# Patient Record
Sex: Male | Born: 1940 | Race: White | Hispanic: No | Marital: Single | Smoking: Never smoker
Health system: Southern US, Community
[De-identification: ages and names within clinical notes are randomized; demographics above are authoritative.]

---

## 2010-03-14 ENCOUNTER — Emergency Department (HOSPITAL_COMMUNITY): Admission: EM | Admit: 2010-03-14 | Discharge: 2010-03-14 | Payer: Self-pay | Admitting: Emergency Medicine

## 2017-01-16 ENCOUNTER — Telehealth (HOSPITAL_COMMUNITY): Payer: Self-pay | Admitting: Pharmacy Technician

## 2020-01-27 ENCOUNTER — Encounter (HOSPITAL_COMMUNITY): Payer: Self-pay | Admitting: Emergency Medicine

## 2020-01-27 ENCOUNTER — Inpatient Hospital Stay (HOSPITAL_COMMUNITY)
Admission: EM | Admit: 2020-01-27 | Discharge: 2020-01-31 | DRG: 374 | Disposition: A | Discharge status: Hospice | Payer: Medicare Other | Attending: Family Medicine | Admitting: Family Medicine

## 2020-01-27 ENCOUNTER — Other Ambulatory Visit: Payer: Self-pay

## 2020-01-27 DIAGNOSIS — R64 Cachexia: Secondary | ICD-10-CM | POA: Diagnosis present

## 2020-01-27 DIAGNOSIS — E861 Hypovolemia: Secondary | ICD-10-CM | POA: Diagnosis present

## 2020-01-27 DIAGNOSIS — J690 Pneumonitis due to inhalation of food and vomit: Secondary | ICD-10-CM | POA: Diagnosis present

## 2020-01-27 DIAGNOSIS — G893 Neoplasm related pain (acute) (chronic): Secondary | ICD-10-CM | POA: Diagnosis present

## 2020-01-27 DIAGNOSIS — D500 Iron deficiency anemia secondary to blood loss (chronic): Secondary | ICD-10-CM | POA: Diagnosis present

## 2020-01-27 DIAGNOSIS — K6289 Other specified diseases of anus and rectum: Secondary | ICD-10-CM | POA: Diagnosis not present

## 2020-01-27 DIAGNOSIS — F419 Anxiety disorder, unspecified: Secondary | ICD-10-CM | POA: Diagnosis not present

## 2020-01-27 DIAGNOSIS — Z66 Do not resuscitate: Secondary | ICD-10-CM | POA: Diagnosis not present

## 2020-01-27 DIAGNOSIS — R531 Weakness: Secondary | ICD-10-CM

## 2020-01-27 DIAGNOSIS — E43 Unspecified severe protein-calorie malnutrition: Secondary | ICD-10-CM | POA: Diagnosis present

## 2020-01-27 DIAGNOSIS — E875 Hyperkalemia: Secondary | ICD-10-CM | POA: Diagnosis present

## 2020-01-27 DIAGNOSIS — C218 Malignant neoplasm of overlapping sites of rectum, anus and anal canal: Principal | ICD-10-CM | POA: Diagnosis present

## 2020-01-27 DIAGNOSIS — R54 Age-related physical debility: Secondary | ICD-10-CM | POA: Diagnosis present

## 2020-01-27 DIAGNOSIS — E872 Acidosis: Secondary | ICD-10-CM | POA: Diagnosis not present

## 2020-01-27 DIAGNOSIS — Z681 Body mass index (BMI) 19 or less, adult: Secondary | ICD-10-CM

## 2020-01-27 DIAGNOSIS — R932 Abnormal findings on diagnostic imaging of liver and biliary tract: Secondary | ICD-10-CM

## 2020-01-27 DIAGNOSIS — J9601 Acute respiratory failure with hypoxia: Secondary | ICD-10-CM | POA: Diagnosis present

## 2020-01-27 DIAGNOSIS — C799 Secondary malignant neoplasm of unspecified site: Secondary | ICD-10-CM

## 2020-01-27 DIAGNOSIS — Z20822 Contact with and (suspected) exposure to covid-19: Secondary | ICD-10-CM | POA: Diagnosis present

## 2020-01-27 DIAGNOSIS — Z789 Other specified health status: Secondary | ICD-10-CM

## 2020-01-27 DIAGNOSIS — E871 Hypo-osmolality and hyponatremia: Secondary | ICD-10-CM | POA: Diagnosis present

## 2020-01-27 DIAGNOSIS — R627 Adult failure to thrive: Secondary | ICD-10-CM | POA: Diagnosis present

## 2020-01-27 DIAGNOSIS — E860 Dehydration: Secondary | ICD-10-CM | POA: Diagnosis present

## 2020-01-27 DIAGNOSIS — N179 Acute kidney failure, unspecified: Secondary | ICD-10-CM | POA: Diagnosis present

## 2020-01-27 DIAGNOSIS — C787 Secondary malignant neoplasm of liver and intrahepatic bile duct: Secondary | ICD-10-CM | POA: Diagnosis present

## 2020-01-27 DIAGNOSIS — C779 Secondary and unspecified malignant neoplasm of lymph node, unspecified: Secondary | ICD-10-CM | POA: Diagnosis present

## 2020-01-27 DIAGNOSIS — Z515 Encounter for palliative care: Secondary | ICD-10-CM | POA: Diagnosis present

## 2020-01-27 LAB — CBC WITH DIFFERENTIAL/PLATELET
Abs Immature Granulocytes: 0.06 10*3/uL (ref 0.00–0.07)
Basophils Absolute: 0 10*3/uL (ref 0.0–0.1)
Basophils Relative: 0 %
Eosinophils Absolute: 0 10*3/uL (ref 0.0–0.5)
Eosinophils Relative: 0 %
HCT: 29.3 % — ABNORMAL LOW (ref 39.0–52.0)
Hemoglobin: 8.4 g/dL — ABNORMAL LOW (ref 13.0–17.0)
Immature Granulocytes: 1 %
Lymphocytes Relative: 9 %
Lymphs Abs: 0.9 10*3/uL (ref 0.7–4.0)
MCH: 23.1 pg — ABNORMAL LOW (ref 26.0–34.0)
MCHC: 28.7 g/dL — ABNORMAL LOW (ref 30.0–36.0)
MCV: 80.5 fL (ref 80.0–100.0)
Monocytes Absolute: 0.6 10*3/uL (ref 0.1–1.0)
Monocytes Relative: 6 %
Neutro Abs: 8.2 10*3/uL — ABNORMAL HIGH (ref 1.7–7.7)
Neutrophils Relative %: 84 %
Platelets: 587 10*3/uL — ABNORMAL HIGH (ref 150–400)
RBC: 3.64 MIL/uL — ABNORMAL LOW (ref 4.22–5.81)
RDW: 21.4 % — ABNORMAL HIGH (ref 11.5–15.5)
WBC: 9.7 10*3/uL (ref 4.0–10.5)
nRBC: 0 % (ref 0.0–0.2)

## 2020-01-27 LAB — COMPREHENSIVE METABOLIC PANEL
ALT: 24 U/L (ref 0–44)
AST: 31 U/L (ref 15–41)
Albumin: 2.3 g/dL — ABNORMAL LOW (ref 3.5–5.0)
Alkaline Phosphatase: 116 U/L (ref 38–126)
Anion gap: 19 — ABNORMAL HIGH (ref 5–15)
BUN: 72 mg/dL — ABNORMAL HIGH (ref 8–23)
CO2: 13 mmol/L — ABNORMAL LOW (ref 22–32)
Calcium: 9.2 mg/dL (ref 8.9–10.3)
Chloride: 96 mmol/L — ABNORMAL LOW (ref 98–111)
Creatinine, Ser: 1.89 mg/dL — ABNORMAL HIGH (ref 0.61–1.24)
GFR calc Af Amer: 39 mL/min — ABNORMAL LOW (ref >60)
GFR calc non Af Amer: 33 mL/min — ABNORMAL LOW (ref >60)
Glucose, Bld: 109 mg/dL — ABNORMAL HIGH (ref 70–99)
Potassium: 5.7 mmol/L — ABNORMAL HIGH (ref 3.5–5.1)
Sodium: 128 mmol/L — ABNORMAL LOW (ref 135–145)
Total Bilirubin: 0.8 mg/dL (ref 0.3–1.2)
Total Protein: 7.5 g/dL (ref 6.5–8.1)

## 2020-01-27 LAB — PROTIME-INR
INR: 1.2 (ref 0.8–1.2)
Prothrombin Time: 14.3 seconds (ref 11.4–15.2)

## 2020-01-27 LAB — SAMPLE TO BLOOD BANK

## 2020-01-27 NOTE — ED Triage Notes (Signed)
Patient arrived with EMS from home reports rectal bleeding/bloody stools for several weeks , emaciated/ underweight / poor appetite . Denies pain/respirations unlabored .

## 2020-01-27 NOTE — ED Notes (Signed)
Pastor ron kelly ,called and said he has the glasses of the pt says he will come to bring them.

## 2020-01-28 ENCOUNTER — Emergency Department (HOSPITAL_COMMUNITY): Payer: Medicare Other

## 2020-01-28 DIAGNOSIS — K6289 Other specified diseases of anus and rectum: Secondary | ICD-10-CM | POA: Diagnosis present

## 2020-01-28 DIAGNOSIS — C799 Secondary malignant neoplasm of unspecified site: Secondary | ICD-10-CM

## 2020-01-28 DIAGNOSIS — Z515 Encounter for palliative care: Secondary | ICD-10-CM | POA: Diagnosis present

## 2020-01-28 DIAGNOSIS — C779 Secondary and unspecified malignant neoplasm of lymph node, unspecified: Secondary | ICD-10-CM | POA: Diagnosis present

## 2020-01-28 DIAGNOSIS — D500 Iron deficiency anemia secondary to blood loss (chronic): Secondary | ICD-10-CM | POA: Diagnosis present

## 2020-01-28 DIAGNOSIS — N179 Acute kidney failure, unspecified: Secondary | ICD-10-CM | POA: Diagnosis present

## 2020-01-28 DIAGNOSIS — E43 Unspecified severe protein-calorie malnutrition: Secondary | ICD-10-CM | POA: Diagnosis present

## 2020-01-28 DIAGNOSIS — R64 Cachexia: Secondary | ICD-10-CM | POA: Diagnosis present

## 2020-01-28 DIAGNOSIS — Z681 Body mass index (BMI) 19 or less, adult: Secondary | ICD-10-CM | POA: Diagnosis not present

## 2020-01-28 DIAGNOSIS — C218 Malignant neoplasm of overlapping sites of rectum, anus and anal canal: Secondary | ICD-10-CM | POA: Diagnosis present

## 2020-01-28 DIAGNOSIS — J690 Pneumonitis due to inhalation of food and vomit: Secondary | ICD-10-CM | POA: Diagnosis present

## 2020-01-28 DIAGNOSIS — R54 Age-related physical debility: Secondary | ICD-10-CM | POA: Diagnosis present

## 2020-01-28 DIAGNOSIS — J9601 Acute respiratory failure with hypoxia: Secondary | ICD-10-CM | POA: Diagnosis present

## 2020-01-28 DIAGNOSIS — E861 Hypovolemia: Secondary | ICD-10-CM | POA: Diagnosis present

## 2020-01-28 DIAGNOSIS — R932 Abnormal findings on diagnostic imaging of liver and biliary tract: Secondary | ICD-10-CM | POA: Diagnosis not present

## 2020-01-28 DIAGNOSIS — E871 Hypo-osmolality and hyponatremia: Secondary | ICD-10-CM | POA: Diagnosis present

## 2020-01-28 DIAGNOSIS — R627 Adult failure to thrive: Secondary | ICD-10-CM | POA: Diagnosis present

## 2020-01-28 DIAGNOSIS — Z66 Do not resuscitate: Secondary | ICD-10-CM | POA: Diagnosis not present

## 2020-01-28 DIAGNOSIS — E860 Dehydration: Secondary | ICD-10-CM | POA: Diagnosis present

## 2020-01-28 DIAGNOSIS — E875 Hyperkalemia: Secondary | ICD-10-CM | POA: Diagnosis present

## 2020-01-28 DIAGNOSIS — G893 Neoplasm related pain (acute) (chronic): Secondary | ICD-10-CM | POA: Diagnosis present

## 2020-01-28 DIAGNOSIS — Z20822 Contact with and (suspected) exposure to covid-19: Secondary | ICD-10-CM | POA: Diagnosis present

## 2020-01-28 DIAGNOSIS — C787 Secondary malignant neoplasm of liver and intrahepatic bile duct: Secondary | ICD-10-CM | POA: Diagnosis present

## 2020-01-28 DIAGNOSIS — E872 Acidosis: Secondary | ICD-10-CM | POA: Diagnosis not present

## 2020-01-28 DIAGNOSIS — F419 Anxiety disorder, unspecified: Secondary | ICD-10-CM | POA: Diagnosis not present

## 2020-01-28 DIAGNOSIS — R531 Weakness: Secondary | ICD-10-CM | POA: Diagnosis not present

## 2020-01-28 LAB — CBC WITH DIFFERENTIAL/PLATELET
Abs Immature Granulocytes: 0.06 10*3/uL (ref 0.00–0.07)
Basophils Absolute: 0 10*3/uL (ref 0.0–0.1)
Basophils Relative: 0 %
Eosinophils Absolute: 0 10*3/uL (ref 0.0–0.5)
Eosinophils Relative: 0 %
HCT: 28.1 % — ABNORMAL LOW (ref 39.0–52.0)
Hemoglobin: 7.8 g/dL — ABNORMAL LOW (ref 13.0–17.0)
Immature Granulocytes: 1 %
Lymphocytes Relative: 6 %
Lymphs Abs: 0.5 10*3/uL — ABNORMAL LOW (ref 0.7–4.0)
MCH: 22.5 pg — ABNORMAL LOW (ref 26.0–34.0)
MCHC: 27.8 g/dL — ABNORMAL LOW (ref 30.0–36.0)
MCV: 81.2 fL (ref 80.0–100.0)
Monocytes Absolute: 0.6 10*3/uL (ref 0.1–1.0)
Monocytes Relative: 7 %
Neutro Abs: 7.4 10*3/uL (ref 1.7–7.7)
Neutrophils Relative %: 86 %
Platelets: 474 10*3/uL — ABNORMAL HIGH (ref 150–400)
RBC: 3.46 MIL/uL — ABNORMAL LOW (ref 4.22–5.81)
RDW: 21.4 % — ABNORMAL HIGH (ref 11.5–15.5)
WBC: 8.6 10*3/uL (ref 4.0–10.5)
nRBC: 0 % (ref 0.0–0.2)

## 2020-01-28 LAB — COMPREHENSIVE METABOLIC PANEL
ALT: 24 U/L (ref 0–44)
AST: 32 U/L (ref 15–41)
Albumin: 2.2 g/dL — ABNORMAL LOW (ref 3.5–5.0)
Alkaline Phosphatase: 105 U/L (ref 38–126)
Anion gap: 19 — ABNORMAL HIGH (ref 5–15)
BUN: 82 mg/dL — ABNORMAL HIGH (ref 8–23)
CO2: 11 mmol/L — ABNORMAL LOW (ref 22–32)
Calcium: 8.7 mg/dL — ABNORMAL LOW (ref 8.9–10.3)
Chloride: 102 mmol/L (ref 98–111)
Creatinine, Ser: 1.72 mg/dL — ABNORMAL HIGH (ref 0.61–1.24)
GFR calc Af Amer: 43 mL/min — ABNORMAL LOW (ref >60)
GFR calc non Af Amer: 38 mL/min — ABNORMAL LOW (ref >60)
Glucose, Bld: 95 mg/dL (ref 70–99)
Potassium: 5.9 mmol/L — ABNORMAL HIGH (ref 3.5–5.1)
Sodium: 132 mmol/L — ABNORMAL LOW (ref 135–145)
Total Bilirubin: 0.8 mg/dL (ref 0.3–1.2)
Total Protein: 7 g/dL (ref 6.5–8.1)

## 2020-01-28 LAB — SARS CORONAVIRUS 2 BY RT PCR (HOSPITAL ORDER, PERFORMED IN ~~LOC~~ HOSPITAL LAB): SARS Coronavirus 2: NEGATIVE

## 2020-01-28 LAB — LACTIC ACID, PLASMA
Lactic Acid, Venous: 4.1 mmol/L (ref 0.5–1.9)
Lactic Acid, Venous: 3.6 mmol/L (ref 0.5–1.9)

## 2020-01-28 MED ORDER — ONDANSETRON HCL 4 MG PO TABS: 4.0000 mg | ORAL_TABLET | Freq: Four times a day (QID) | ORAL | Status: DC | PRN

## 2020-01-28 MED ORDER — FLEET ENEMA 7-19 GM/118ML RE ENEM
1.0000 | ENEMA | Freq: Once | RECTAL | Status: DC
  Filled 2020-01-28: qty 1

## 2020-01-28 MED ORDER — MORPHINE SULFATE (PF) 2 MG/ML IV SOLN
2.0000 mg | Freq: Once | INTRAVENOUS | Status: AC
  Administered 2020-01-28: 2 mg via INTRAVENOUS
  Filled 2020-01-28: qty 1

## 2020-01-28 MED ORDER — SENNA 8.6 MG PO TABS
1.0000 | ORAL_TABLET | Freq: Two times a day (BID) | ORAL | Status: DC
  Administered 2020-01-28: 8.6 mg via ORAL
  Filled 2020-01-28: qty 1

## 2020-01-28 MED ORDER — OXYCODONE HCL 5 MG PO TABS: 5.0000 mg | ORAL_TABLET | ORAL | Status: DC | PRN

## 2020-01-28 MED ORDER — LACTATED RINGERS IV SOLN: INTRAVENOUS | Status: DC

## 2020-01-28 MED ORDER — SODIUM CHLORIDE 0.9 % IV BOLUS
500.0000 mL | Freq: Once | INTRAVENOUS | Status: AC
  Administered 2020-01-28: 500 mL via INTRAVENOUS

## 2020-01-28 MED ORDER — ACETAMINOPHEN 325 MG PO TABS: 650.0000 mg | ORAL_TABLET | Freq: Four times a day (QID) | ORAL | Status: DC | PRN

## 2020-01-28 MED ORDER — SODIUM CHLORIDE 0.9 % IV SOLN: INTRAVENOUS | Status: DC

## 2020-01-28 MED ORDER — ACETAMINOPHEN 650 MG RE SUPP: 650.0000 mg | Freq: Four times a day (QID) | RECTAL | Status: DC | PRN

## 2020-01-28 MED ORDER — FLEET ENEMA 7-19 GM/118ML RE ENEM: 1.0000 | ENEMA | Freq: Once | RECTAL | Status: DC

## 2020-01-28 MED ORDER — ENOXAPARIN SODIUM 30 MG/0.3ML ~~LOC~~ SOLN: 30.0000 mg | SUBCUTANEOUS | Status: DC

## 2020-01-28 MED ORDER — MORPHINE SULFATE (PF) 2 MG/ML IV SOLN: 2.0000 mg | INTRAVENOUS | Status: DC | PRN

## 2020-01-28 MED ORDER — POLYETHYLENE GLYCOL 3350 17 G PO PACK: 17.0000 g | PACK | Freq: Every day | ORAL | Status: DC

## 2020-01-28 MED ORDER — SODIUM CHLORIDE 0.9 % IV SOLN: Freq: Once | INTRAVENOUS | Status: AC

## 2020-01-28 MED ORDER — SODIUM CHLORIDE 0.9 % IV SOLN
3.0000 g | Freq: Once | INTRAVENOUS | Status: AC
  Administered 2020-01-28: 3 g via INTRAVENOUS
  Filled 2020-01-28: qty 8

## 2020-01-28 MED ORDER — LACTATED RINGERS IV BOLUS: 1000.0000 mL | Freq: Once | INTRAVENOUS | Status: DC

## 2020-01-28 MED ORDER — SODIUM CHLORIDE 0.9 % IV BOLUS
1000.0000 mL | Freq: Once | INTRAVENOUS | Status: AC
  Administered 2020-01-28: 1000 mL via INTRAVENOUS

## 2020-01-28 MED ORDER — ONDANSETRON HCL 4 MG/2ML IJ SOLN: 4.0000 mg | Freq: Four times a day (QID) | INTRAMUSCULAR | Status: DC | PRN

## 2020-01-28 NOTE — ED Notes (Signed)
Unable to obtain blood after multiple attempts. Lab at bedside

## 2020-01-28 NOTE — Progress Notes (Signed)
CT reviewed. Plan for flex sig tomorrow, NPO after midnight. Consult for IR guided liver biopsy due to concern for liver metastasis. Full consult to follow.

## 2020-01-28 NOTE — ED Notes (Signed)
Pt's BP 80/60, NS bolus started per verbal order Janetta Hora PA

## 2020-01-28 NOTE — Plan of Care (Signed)

## 2020-01-28 NOTE — ED Notes (Signed)
Critical lactic acid of 4.1 called to this RN

## 2020-01-28 NOTE — ED Notes (Addendum)
Pt requests Christ Kick to be his emergency contact and can receive information. Will be unable to reach Marylou Flesher between 2300-0600 d/t sleep medication. Christ Kick [phone]

## 2020-01-28 NOTE — ED Notes (Signed)
Pt on 5L O2 via [place], SpO2 now 92%

## 2020-01-28 NOTE — ED Provider Notes (Signed)
[facility] EMERGENCY DEPARTMENT Provider Note   CSN: [id number] Arrival date & time: 01/27/20  2119   History Chief Complaint  Patient presents with  . GI Bleeding    Ralph Hardin is a 79 y.o. male with no known PMH presents with failure to thrive and rectal pain. The patient is difficult to understand limiting history. He states he feels like he's on his last leg. He has been waiting for about 15 hours prior to my evaluation. He states he came today because a friend made him. He lives on his own and doesn't have any family close by. He hasn't been eating or drinking much. He states he is hurting in his rectum. He has a rectal mass that has been present for about 2 years. He denies pain anywhere else. He denies fever, chills, chest pain, SOB, abdominal pain, N/V/D. He denies rectal bleeding to me although triage note states he's been having a GI bleed.  HPI     History reviewed. No pertinent past medical history.  There are no problems to display for this patient.   History reviewed. No pertinent surgical history.     No family history on file.  Social History   Tobacco Use  . Smoking status: Never Smoker  . Smokeless tobacco: Never Used  Substance Use Topics  . Alcohol use: Never  . Drug use: Never    Home Medications Prior to Admission medications   Not on File    Allergies    Patient has no known allergies.  Review of Systems   Review of Systems  Constitutional: Positive for activity change, appetite change and unexpected weight change. Negative for chills and fever.  Respiratory: Negative for cough and shortness of breath.   Cardiovascular: Negative for chest pain, palpitations and leg swelling.  Gastrointestinal: Positive for rectal pain. Negative for abdominal pain, diarrhea, nausea and vomiting.  Genitourinary: Negative for dysuria.  Neurological: Negative for headaches.    Physical Exam Updated Vital Signs BP (!) 120/98 (BP  Location: Right Arm)   Pulse 84   Temp (!) 97.5 F (36.4 C) (Oral)   Resp 16   SpO2 94%   Physical Exam Vitals and nursing note reviewed.  Constitutional:      General: He is not in acute distress.    Appearance: He is cachectic. He is ill-appearing.  HENT:     Head: Normocephalic and atraumatic.     Mouth/Throat:     Comments: Poor dentition Eyes:     General: No scleral icterus.       Right eye: No discharge.        Left eye: No discharge.     Conjunctiva/sclera: Conjunctivae normal.     Pupils: Pupils are equal, round, and reactive to light.  Cardiovascular:     Rate and Rhythm: Normal rate and regular rhythm.  Pulmonary:     Effort: Pulmonary effort is normal. No respiratory distress.     Breath sounds: Rhonchi present.  Abdominal:     General: There is no distension.     Palpations: Abdomen is soft.     Tenderness: There is no abdominal tenderness.  Genitourinary:    Comments: Large friable rectal mass which is bleeding a small amount Musculoskeletal:     Cervical back: Normal range of motion.  Skin:    General: Skin is warm and dry.  Neurological:     Mental Status: He is alert and oriented to person, place, and time.  Psychiatric:  Behavior: Behavior normal. Behavior is cooperative.       ED Results / Procedures / Treatments   Labs (all labs ordered are listed, but only abnormal results are displayed) Labs Reviewed  CBC WITH DIFFERENTIAL/PLATELET - Abnormal; Notable for the following components:      Result Value   RBC 3.64 (*)    Hemoglobin 8.4 (*)    HCT 29.3 (*)    MCH 23.1 (*)    MCHC 28.7 (*)    RDW 21.4 (*)    Platelets 587 (*)    Neutro Abs 8.2 (*)    All other components within normal limits  COMPREHENSIVE METABOLIC PANEL - Abnormal; Notable for the following components:   Sodium 128 (*)    Potassium 5.7 (*)    Chloride 96 (*)    CO2 13 (*)    Glucose, Bld 109 (*)    BUN 72 (*)    Creatinine, Ser 1.89 (*)    Albumin 2.3 (*)     GFR calc non Af Amer 33 (*)    GFR calc Af Amer 39 (*)    Anion gap 19 (*)    All other components within normal limits  LACTIC ACID, PLASMA - Abnormal; Notable for the following components:   Lactic Acid, Venous 4.1 (*)    All other components within normal limits  COMPREHENSIVE METABOLIC PANEL - Abnormal; Notable for the following components:   Sodium 132 (*)    Potassium 5.9 (*)    CO2 11 (*)    BUN 82 (*)    Creatinine, Ser 1.72 (*)    Calcium 8.7 (*)    Albumin 2.2 (*)    GFR calc non Af Amer 38 (*)    GFR calc Af Amer 43 (*)    Anion gap 19 (*)    All other components within normal limits  SARS CORONAVIRUS 2 BY RT PCR (HOSPITAL ORDER, [place] [facility])  CULTURE, BLOOD (ROUTINE X 2)  CULTURE, BLOOD (ROUTINE X 2)  PROTIME-INR  LACTIC ACID, PLASMA  CBC WITH DIFFERENTIAL/PLATELET  SAMPLE TO BLOOD BANK    EKG None  Radiology CT Abdomen Pelvis Wo Contrast  Result Date: 01/28/2020 CLINICAL DATA:  Rectal mass and bleeding.  Cough. EXAM: CT CHEST, ABDOMEN AND PELVIS WITHOUT CONTRAST TECHNIQUE: Multidetector CT imaging of the chest, abdomen and pelvis was performed following the standard protocol without IV contrast. COMPARISON:  Chest x-ray from same day. FINDINGS: CT CHEST FINDINGS Cardiovascular: No significant vascular findings. Normal heart size. No pericardial effusion. No thoracic aortic aneurysm. Coronary, aortic arch, and branch vessel atherosclerotic vascular disease. Mediastinum/Nodes: No enlarged mediastinal, hilar, or axillary lymph nodes. Thyroid gland and esophagus demonstrate no significant findings. Lungs/Pleura: Secretions in the lower trachea and both mainstem and lower lobe bronchi. Centrilobular ground-glass and tree-in-bud nodularity in the left greater than right lower lobes. No pleural effusion or pneumothorax. No suspicious pulmonary nodule. Musculoskeletal: No chest wall mass or suspicious bone lesions identified. CT ABDOMEN PELVIS FINDINGS  Hepatobiliary: Ill-defined hypodense lesion in the hepatic dome measuring approximately 3.0 cm (series 3, image 56). Additional ill-defined areas of hypodensity in the inferior right hepatic lobe are less well-defined. Gallbladder is unremarkable. No biliary dilatation. Pancreas: Unremarkable. No pancreatic ductal dilatation or surrounding inflammatory changes. Spleen: Small in size with multiple calcified granulomas. Adrenals/Urinary Tract: Adrenal glands are unremarkable. Kidneys are normal, without renal calculi, focal lesion, or hydronephrosis. Bladder is unremarkable. Stomach/Bowel: Large mass circumferentially involving the rectum and extending through the  anus, measuring approximately 8.3 x 7.8 x 11.4 cm (AP by transverse by CC). The mass is inseparable from the posterior wall of the prostate gland. The remaining colon is unremarkable. The stomach is within normal limits. No obstruction. Normal appendix. Vascular/Lymphatic: Enlarged presacral lymph node measuring 1.7 cm in short axis (series 3, image 107). No additional enlarged abdominal or pelvic lymph nodes. Aortoiliac atherosclerotic vascular disease. Reproductive: Mild prostatomegaly. Other: No free fluid or pneumoperitoneum. Musculoskeletal: No acute or significant osseous findings. IMPRESSION: 1. Large anorectal mass circumferentially involving the rectum and extending through the anus, measuring approximately 8.3 x 7.8 x 11.4 cm, consistent with malignancy. The mass is inseparable from the posterior wall of the prostate gland, concerning for local invasion. 2. Enlarged presacral lymph node, consistent with nodal metastatic disease. 3. Ill-defined hypodense lesion in the hepatic dome measuring approximately 3.0 cm, concerning for hepatic metastatic disease. Additional ill-defined areas of hypodensity in the inferior right hepatic lobe are less well-defined. 4. Centrilobular ground-glass and tree-in-bud nodularity in the left greater than right lower  lobes with associated tracheal and bronchial secretions, consistent with aspiration. 5. Aortic Atherosclerosis (ICD10-I70.0). Electronically Signed   By: Titus Dubin M.D.   On: 01/28/2020 15:09   CT Chest Wo Contrast  Result Date: 01/28/2020 CLINICAL DATA:  Rectal mass and bleeding.  Cough. EXAM: CT CHEST, ABDOMEN AND PELVIS WITHOUT CONTRAST TECHNIQUE: Multidetector CT imaging of the chest, abdomen and pelvis was performed following the standard protocol without IV contrast. COMPARISON:  Chest x-ray from same day. FINDINGS: CT CHEST FINDINGS Cardiovascular: No significant vascular findings. Normal heart size. No pericardial effusion. No thoracic aortic aneurysm. Coronary, aortic arch, and branch vessel atherosclerotic vascular disease. Mediastinum/Nodes: No enlarged mediastinal, hilar, or axillary lymph nodes. Thyroid gland and esophagus demonstrate no significant findings. Lungs/Pleura: Secretions in the lower trachea and both mainstem and lower lobe bronchi. Centrilobular ground-glass and tree-in-bud nodularity in the left greater than right lower lobes. No pleural effusion or pneumothorax. No suspicious pulmonary nodule. Musculoskeletal: No chest wall mass or suspicious bone lesions identified. CT ABDOMEN PELVIS FINDINGS Hepatobiliary: Ill-defined hypodense lesion in the hepatic dome measuring approximately 3.0 cm (series 3, image 56). Additional ill-defined areas of hypodensity in the inferior right hepatic lobe are less well-defined. Gallbladder is unremarkable. No biliary dilatation. Pancreas: Unremarkable. No pancreatic ductal dilatation or surrounding inflammatory changes. Spleen: Small in size with multiple calcified granulomas. Adrenals/Urinary Tract: Adrenal glands are unremarkable. Kidneys are normal, without renal calculi, focal lesion, or hydronephrosis. Bladder is unremarkable. Stomach/Bowel: Large mass circumferentially involving the rectum and extending through the anus, measuring  approximately 8.3 x 7.8 x 11.4 cm (AP by transverse by CC). The mass is inseparable from the posterior wall of the prostate gland. The remaining colon is unremarkable. The stomach is within normal limits. No obstruction. Normal appendix. Vascular/Lymphatic: Enlarged presacral lymph node measuring 1.7 cm in short axis (series 3, image 107). No additional enlarged abdominal or pelvic lymph nodes. Aortoiliac atherosclerotic vascular disease. Reproductive: Mild prostatomegaly. Other: No free fluid or pneumoperitoneum. Musculoskeletal: No acute or significant osseous findings. IMPRESSION: 1. Large anorectal mass circumferentially involving the rectum and extending through the anus, measuring approximately 8.3 x 7.8 x 11.4 cm, consistent with malignancy. The mass is inseparable from the posterior wall of the prostate gland, concerning for local invasion. 2. Enlarged presacral lymph node, consistent with nodal metastatic disease. 3. Ill-defined hypodense lesion in the hepatic dome measuring approximately 3.0 cm, concerning for hepatic metastatic disease. Additional ill-defined areas of hypodensity in the inferior  right hepatic lobe are less well-defined. 4. Centrilobular ground-glass and tree-in-bud nodularity in the left greater than right lower lobes with associated tracheal and bronchial secretions, consistent with aspiration. 5. Aortic Atherosclerosis (ICD10-I70.0). Electronically Signed   By: Titus Dubin M.D.   On: 01/28/2020 15:09   DG Chest Port 1 View  Result Date: 01/28/2020 CLINICAL DATA:  Shortness of breath EXAM: PORTABLE CHEST 1 VIEW COMPARISON:  None. FINDINGS: There is no consolidation or edema. No pleural effusion or pneumothorax. Cardiomediastinal contours are within normal limits with normal heart size. IMPRESSION: No acute process in the chest. Electronically Signed   By: Macy Mis M.D.   On: 01/28/2020 14:02    Procedures Procedures (including critical care time)  CRITICAL  CARE Performed by: Recardo Evangelist   Total critical care time: 35  minutes  Critical care time was exclusive of separately billable procedures and treating other patients.  Critical care was necessary to treat or prevent imminent or life-threatening deterioration.  Critical care was time spent personally by me on the following activities: development of treatment plan with patient and/or surrogate as well as nursing, discussions with consultants, evaluation of patient's response to treatment, examination of patient, obtaining history from patient or surrogate, ordering and performing treatments and interventions, ordering and review of laboratory studies, ordering and review of radiographic studies, pulse oximetry and re-evaluation of patient's condition.   Medications Ordered in ED Medications  Ampicillin-Sulbactam (UNASYN) 3 g in sodium chloride 0.9 % 100 mL IVPB (has no administration in time range)  morphine 2 MG/ML injection 2 mg (has no administration in time range)  sodium chloride 0.9 % bolus 500 mL (has no administration in time range)  0.9 %  sodium chloride infusion ( Intravenous New Bag/Given 01/28/20 1440)  sodium chloride 0.9 % bolus 1,000 mL (0 mLs Intravenous Stopped 01/28/20 1440)    ED Course  I have reviewed the triage vital signs and the nursing notes.  Pertinent labs & imaging results that were available during my care of the patient were reviewed by me and considered in my medical decision making (see chart for details).  79 year old male presents with failure to thrive, rectal mass, and hypoxia. Pt is somewhat of a poor historian limiting his history but it appears that he has not been taking care of himself and has not sought any medical care for a long time. EKG is SR with LAFB and borderline prolonged QT. Labs from last night show normal WBC, hgb 8.4, thrombocytosis 587. CMP shows hyponatremia (128), hyperkalemia (5.7), low bicarb (13), likely AKI (BUN 72/SCr  1.8), with elevated anion gap. Pt is ill appearing and cachetic. He has rhonchi on his lung exam and a large rectal mass. Will repeat labs and add on lacate and discuss with GI.  Discussed with Dr. Luan Pulling - he recommends CT of the abdomen/pelvis and will see tomorrow. Will also add on CT chest since there is a high suspicion for malignancy.  CT chest/abdomen/pelvis shows :  IMPRESSION: 1. Large anorectal mass circumferentially involving the rectum and extending through the anus, measuring approximately 8.3 x 7.8 x 11.4 cm, consistent with malignancy. The mass is inseparable from the posterior wall of the prostate gland, concerning for local invasion. 2. Enlarged presacral lymph node, consistent with nodal metastatic disease. 3. Ill-defined hypodense lesion in the hepatic dome measuring approximately 3.0 cm, concerning for hepatic metastatic disease. Additional ill-defined areas of hypodensity in the inferior right hepatic lobe are less well-defined. 4. Centrilobular ground-glass  and tree-in-bud nodularity in the left greater than right lower lobes with associated tracheal and bronchial secretions, consistent with aspiration. 5. Aortic Atherosclerosis (ICD10-I70.0). Electronically Signed   By: Titus Dubin M.D.   On: 01/28/2020 15:09   Pt has been requiring O2 (3L via [place]) here so will cover for an aspiration pneumonia. Pt was given 30cc/kg bolus of fluid and started on NS infusion and this is improving his BP. Shared visit with Dr. Regenia Skeeter. Code status discussed with the patient and he would like to be DNR. He would like to think about any further treatment of a rectal cancer before making a decision. Will admit for further management. He would benefit from palliative care consult while inpatient.  Discussed with IM team who will admit.   MDM Rules/Calculators/A&P                           Final Clinical Impression(s) / ED Diagnoses Final diagnoses:  Rectal mass  Metastatic malignant  neoplasm, unspecified site [facility])  Aspiration pneumonia of both lungs, unspecified aspiration pneumonia type, unspecified part of lung ([facility])  Failure to thrive in adult    Rx / DC Orders ED Discharge Orders    None       Recardo Evangelist, PA-C 01/28/20 1556    Sherwood Gambler, MD 01/28/20 1624

## 2020-01-28 NOTE — ED Notes (Signed)
Pt nurse transported to CT on cardiac monitor and oxygen

## 2020-01-28 NOTE — H&P (Addendum)
[facility] Admission History and Physical Service Pager: [phone]  Patient name: Ralph Hardin Medical record number: [id number] Date of birth: [birth year] Age: 79 y.o. Gender: male  Primary Care Provider: Patient, No Pcp Per Consultants: None Code Status: DNR Preferred Emergency Contact: Alba Cory (pastor) [phone]    Chief Complaint: rectal pain, weakness, weight loss  Assessment and Plan: Ralph Hardin is a 79 y.o. male presenting with rectal pain. Unknown PMH as patient has not followed with doctor in many years.  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 Has never had a colonoscopy. Patient presenting with 50lb unintentional weight loss in the last year, increased weakness, lethargy and enlarging rectal mass. CT abd/pelvis unfortunately shows large anorectal mass consistent with malignancy. There is concern for local invasion since mass is inseparable from the posterior wall of the prostate gland. Additionally, enlarged presacral lymph node, consistent with nodal metastatic disease and possible hepatic mets. Patient is frail and cachectic on exam with temporal waisting. Patient does not have any family in the area, would like his pastor to be primary contact. Patient with hyponatremia, hyperkalemia, BUN/Cr 47.67, likely due to severely decreased PO intake/dehydration as well as increased tissue catabolism.  AST/ALT WNL. Albumin decreased to 2.2 but total protein WNL. - Admit to med-surg, attending Dr. Andria Frames  - GI consulted in ED, appreciate recommendations - flex sigmoidoscopy, liver biopsy - Palliative consult  - Nutrition consult - OOB with assistance - Fall precautions  - Clear liquid diet, NPO at midnight for possible flexible sigmoidoscopy - Pain regimen: Tylenol 650 mg q6h, Oxy 5 mg q4h, Morphine 2 mg IV q4h PRN for  severe pain - Bowel regimen: senna, MiraLAX daily  - Zofran PO PRN nausea, vomiting - Vital signs per floor routine - VTE ppx with Lovenox   Acute Hypoxic Respiratory Failure 2/2 Aspiration PNA  Patient lives alone, has not been eating or drinking much. CT chest with evidence of aspiration. Required supplemental oxygen in ED, up to 6L [place] for desaturations to 87%. Weaned in room back down to 3L [place]. Has non-productive cough. Unasyn started in ED.  - Continue Unasyn (8/11-) - incentive spirometry  - Vital signs per floor protocol, keep saturations >92% - Blood cultures  Symptomatic Anemia  Hgb 8.4 yesterday, down to 7.8 today. Patient notes occasional dark red blood in his stools. Feels increasingly weak and tired. Notes dyspnea on exertion. GI consulted in ED. They have reviewed CT scan and plan for potential flexible sigmoidoscopy tomorrow. Patient does not want aggressive management. Palliative consult to establish goals of care. Briefly discussed with patient about his thoughts on transfusion if necessary, states he would not want it. - CBC daily  - Palliative consult to further discuss goals of care - Vitals per floor protocol - Follow up with with possible flexible sigmoidoscopy tomorrow - Appreciate GI recommendations  Electrolyte Derrangements  Possible Pre-renal AKI  Na 132, Potassium 5.7. Creatinine 1.72. Unknown baseline. Has been receiving fluids in ED. GFR 38, BUN 82. In the setting of dehydration and reduced PO intake. Suspect hypovolemic hyponatremia from dehydration and extrarenal losses 2/2 GI bleed. Hyperkalemia could be a result of tissue catabolism from malignancy or arterial blood volume depletion. Pre-renal AKI   - Continue gentle hydration, NS at 50 mL/hr - CMP in AM - Consider lokelma for hyperkalemia - Consider nutrition consult, nutritional supplements  FEN/GI: Clear liquid  diet, NPO at midnight Prophylaxis: Lovenox  Disposition: Inpatient  History of Present  Illness:  Ralph Hardin is a 79 y.o. male presenting with increased rectal pain.  Has lost 50 lbs in 1 year, not eating for a while, feels like it doesn't go down. Has been feeling very weak. He lives alone, no family nearby. He has been having pain in rectal area, started a couple years ago and has gradually been getting worse and worse. When he has pain he tries to lay down so that the pain goes away, when he sits up, stands or walks it starts to worsen. No abdominal pain. He sees a little bit of blood per rectum, but not much, says it was dark red. He has not seen a doctor recently. Has never had a colonoscopy. Denies chest pain, trouble breathing, he does have a little cough but nothing comes up. Denies fevers, chills, night sweats. Urinary system is fairly normal, little bit of pain with urination but no blood. He is feeling more and more tired.   ED Course: Large friable rectal mass on examination. Several electrolyte derangements and soft BPs, given fluids. Hypoxic, put on 3L [place]. Labs: Lactate 4, hgb 8> 7.8. CT with aspiration PNA and suspicion for anorectal malignancy with possible mets. Started Unasyn. Consulted GI.    Review Of Systems: Per HPI with the following additions:   Review of Systems  Constitutional: Positive for activity change, appetite change, fatigue and unexpected weight change.  Respiratory: Positive for cough. Negative for shortness of breath.   Cardiovascular: Negative for chest pain.  Gastrointestinal: Positive for anal bleeding, blood in stool and rectal pain. Negative for abdominal pain.  Genitourinary: Positive for dysuria.  Neurological: Positive for weakness.     There are no problems to display for this patient.   Past Medical History: History reviewed. No pertinent past medical history.  Past Surgical History: History reviewed. No pertinent surgical history.  Social History: Social History   Tobacco Use   Smoking status: Never Smoker   Smokeless  tobacco: Never Used  Substance Use Topics   Alcohol use: Never   Drug use: Never   Please also refer to relevant sections of EMR.  Family History: No family history on file.  Allergies and Medications: No Known Allergies No current facility-administered medications on file prior to encounter.   No current outpatient medications on file prior to encounter.    Objective: BP 112/76   Pulse 72   Temp (!) 97.2 F (36.2 C) (Oral)   Resp 19   Ht 5\' 7"  (1.702 m)   Wt 44.5 kg   SpO2 98%   BMI 15.35 kg/m  Exam: General: cachetic appearing, frail gentleman Eyes: anicteric, EOMI, PERRLA ENTM: temporal wasting, dry mucous membranes, poor dentition Neck: supple Cardiovascular: RRR Respiratory: 3L [place], clear to auscultation anteriorly, no increased work of breathing, no accessory muscle use  Gastrointestinal: abdomen sunken in, hypoactive bowel sounds MSK: moves all extremities spontaneously Derm: skin cold to touch in all extremities Neuro: Speaking in full sentences. Though hard to understand speech is not slurred. Moving extremities, no obvious cranial abnormalities Psych: normal judgement, mood appropriate  Labs and Imaging: CBC BMET  Recent Labs  Lab 01/27/20 2133  WBC 9.7  HGB 8.4*  HCT 29.3*  PLT 587*   Recent Labs  Lab 01/28/20 1355  NA 132*  K 5.9*  CL 102  CO2 11*  BUN 82*  CREATININE 1.72*  GLUCOSE 95  CALCIUM 8.7*  EKG: NS rate 81, Left anterior fascicular block  CT Chest wo Contrast and Abdomen/Pelvis 8/11 IMPRESSION: 1. Large anorectal mass circumferentially involving the rectum and extending through the anus, measuring approximately 8.3 x 7.8 x 11.4 cm, consistent with malignancy. The mass is inseparable from the posterior wall of the prostate gland, concerning for local invasion. 2. Enlarged presacral lymph node, consistent with nodal metastatic disease. 3. Ill-defined hypodense lesion in the hepatic dome measuring approximately 3.0 cm,  concerning for hepatic metastatic disease. Additional ill-defined areas of hypodensity in the inferior right hepatic lobe are less well-defined. 4. Centrilobular ground-glass and tree-in-bud nodularity in the left greater than right lower lobes with associated tracheal and bronchial secretions, consistent with aspiration. 5. Aortic Atherosclerosis (ICD10-I70.0).  DG Chest 1 View 8/11 IMPRESSION: No acute process in the chest.  Sharion Settler, DO 01/28/2020, 3:39 PM PGY-1, [place] Intern pager: [phone], text pages welcome   FPTS Upper-Level Resident Addendum I have independently interviewed and examined the patient. I have discussed the above with the original author and agree with their documentation.    Milus Banister, DO PGY-3, [facility] 01/29/2020 3:33 PM  FPTS Service pager: [phone] (text pages welcome through [facility])

## 2020-01-29 ENCOUNTER — Encounter (HOSPITAL_COMMUNITY): Payer: Self-pay | Admitting: Family Medicine

## 2020-01-29 ENCOUNTER — Encounter (HOSPITAL_COMMUNITY)
Admission: EM | Disposition: A | Discharge status: Hospice | Payer: Self-pay | Source: Home / Self Care | Attending: Family Medicine

## 2020-01-29 ENCOUNTER — Encounter (HOSPITAL_COMMUNITY): Payer: Self-pay | Admitting: Certified Registered Nurse Anesthetist

## 2020-01-29 DIAGNOSIS — K6289 Other specified diseases of anus and rectum: Secondary | ICD-10-CM

## 2020-01-29 DIAGNOSIS — C799 Secondary malignant neoplasm of unspecified site: Secondary | ICD-10-CM

## 2020-01-29 DIAGNOSIS — R627 Adult failure to thrive: Secondary | ICD-10-CM

## 2020-01-29 DIAGNOSIS — E43 Unspecified severe protein-calorie malnutrition: Secondary | ICD-10-CM | POA: Diagnosis not present

## 2020-01-29 DIAGNOSIS — J690 Pneumonitis due to inhalation of food and vomit: Secondary | ICD-10-CM | POA: Diagnosis not present

## 2020-01-29 DIAGNOSIS — Z7189 Other specified counseling: Secondary | ICD-10-CM

## 2020-01-29 LAB — COMPREHENSIVE METABOLIC PANEL
ALT: 24 U/L (ref 0–44)
AST: 32 U/L (ref 15–41)
Albumin: 1.9 g/dL — ABNORMAL LOW (ref 3.5–5.0)
Alkaline Phosphatase: 91 U/L (ref 38–126)
Anion gap: 12 (ref 5–15)
BUN: 76 mg/dL — ABNORMAL HIGH (ref 8–23)
CO2: 15 mmol/L — ABNORMAL LOW (ref 22–32)
Calcium: 8.1 mg/dL — ABNORMAL LOW (ref 8.9–10.3)
Chloride: 107 mmol/L (ref 98–111)
Creatinine, Ser: 1.47 mg/dL — ABNORMAL HIGH (ref 0.61–1.24)
GFR calc Af Amer: 53 mL/min — ABNORMAL LOW (ref >60)
GFR calc non Af Amer: 45 mL/min — ABNORMAL LOW (ref >60)
Glucose, Bld: 84 mg/dL (ref 70–99)
Potassium: 5.1 mmol/L (ref 3.5–5.1)
Sodium: 134 mmol/L — ABNORMAL LOW (ref 135–145)
Total Bilirubin: 0.5 mg/dL (ref 0.3–1.2)
Total Protein: 6.2 g/dL — ABNORMAL LOW (ref 6.5–8.1)

## 2020-01-29 LAB — CBC
HCT: 24.9 % — ABNORMAL LOW (ref 39.0–52.0)
Hemoglobin: 7.3 g/dL — ABNORMAL LOW (ref 13.0–17.0)
MCH: 23.2 pg — ABNORMAL LOW (ref 26.0–34.0)
MCHC: 29.3 g/dL — ABNORMAL LOW (ref 30.0–36.0)
MCV: 79 fL — ABNORMAL LOW (ref 80.0–100.0)
Platelets: 420 10*3/uL — ABNORMAL HIGH (ref 150–400)
RBC: 3.15 MIL/uL — ABNORMAL LOW (ref 4.22–5.81)
RDW: 21.5 % — ABNORMAL HIGH (ref 11.5–15.5)
WBC: 8 10*3/uL (ref 4.0–10.5)
nRBC: 0 % (ref 0.0–0.2)

## 2020-01-29 SURGERY — SIGMOIDOSCOPY, FLEXIBLE
Anesthesia: Monitor Anesthesia Care

## 2020-01-29 MED ORDER — LIP MEDEX EX OINT
TOPICAL_OINTMENT | CUTANEOUS | Status: DC | PRN
  Filled 2020-01-29 (×2): qty 7

## 2020-01-29 MED ORDER — POLYETHYLENE GLYCOL 3350 17 G PO PACK: 17.0000 g | PACK | Freq: Every day | ORAL | Status: DC | PRN

## 2020-01-29 MED ORDER — BLISTEX MEDICATED EX OINT: TOPICAL_OINTMENT | CUTANEOUS | Status: DC | PRN

## 2020-01-29 MED ORDER — BIOTENE DRY MOUTH MT LIQD: 15.0000 mL | OROMUCOSAL | Status: DC | PRN

## 2020-01-29 MED ORDER — GLYCOPYRROLATE 0.2 MG/ML IJ SOLN
0.2000 mg | INTRAMUSCULAR | Status: DC | PRN
  Filled 2020-01-29: qty 1

## 2020-01-29 MED ORDER — FLEET ENEMA 7-19 GM/118ML RE ENEM: 1.0000 | ENEMA | Freq: Every day | RECTAL | Status: DC | PRN

## 2020-01-29 MED ORDER — MORPHINE SULFATE (PF) 2 MG/ML IV SOLN
2.0000 mg | INTRAVENOUS | Status: DC | PRN
  Administered 2020-01-29 – 2020-01-30 (×2): 2 mg via INTRAVENOUS
  Filled 2020-01-29 (×2): qty 1

## 2020-01-29 MED ORDER — LORAZEPAM 2 MG/ML IJ SOLN: 0.5000 mg | INTRAMUSCULAR | Status: DC | PRN

## 2020-01-29 MED ORDER — POLYVINYL ALCOHOL 1.4 % OP SOLN
1.0000 [drp] | Freq: Four times a day (QID) | OPHTHALMIC | Status: DC | PRN
  Filled 2020-01-29: qty 15

## 2020-01-29 MED ORDER — SENNA 8.6 MG PO TABS: 1.0000 | ORAL_TABLET | Freq: Two times a day (BID) | ORAL | Status: DC | PRN

## 2020-01-29 NOTE — TOC Initial Note (Signed)
Transition of Care [facility]) - Initial/Assessment Note    Patient Details  Name: Ralph Hardin MRN: [id number] Date of Birth: 1940-12-22  Transition of Care [facility]) CM/SW Contact:    Sharin Mons, RN Phone Number: 01/29/2020, 3:52 PM  Clinical Narrative:                 NCM received consult : Requesting [place]. NCM spoke with pt and pt confirmed he would like to transition to [facility]. Referral made with Manufacturing engineer. TOC team will continue to monitor for needs....  Expected Discharge Plan: [place] Barriers to Discharge: Continued Medical Work up   Patient Goals and CMS Choice        Expected Discharge Plan and Services Expected Discharge Plan: [place]                                              Prior Living Arrangements/Services                       Activities of Daily Living Home Assistive Devices/Equipment: Environmental consultant (specify type) ADL Screening (condition at time of admission) Patient's cognitive ability adequate to safely complete daily activities?: Yes Is the patient deaf or have difficulty hearing?: No Does the patient have difficulty seeing, even when wearing glasses/contacts?: No Does the patient have difficulty concentrating, remembering, or making decisions?: No Patient able to express need for assistance with ADLs?: Yes Does the patient have difficulty dressing or bathing?: No Independently performs ADLs?: Yes (appropriate for developmental age) Does the patient have difficulty walking or climbing stairs?: Yes Weakness of Legs: Both Weakness of Arms/Hands: Both  Permission Sought/Granted                  Emotional Assessment              Admission diagnosis:  Rectal mass [K62.89] Failure to thrive in adult [R62.7] Abnormal CT of liver [R93.2] Aspiration pneumonia of both lungs, unspecified aspiration pneumonia type, unspecified part of lung ([place]) [J69.0] Metastatic  malignant neoplasm, unspecified site [facility]) [C79.9] Patient Active Problem List   Diagnosis Date Noted  . Failure to thrive in adult 01/28/2020   PCP:  Patient, No Pcp Per Pharmacy:   [facility] [place], [place] [address] [place] [address] Phone: [phone] Fax: [phone]     Social Determinants of Health ([place]) Interventions    Readmission Risk Interventions No flowsheet data found.

## 2020-01-29 NOTE — Plan of Care (Signed)
  Problem: Education: Goal: Knowledge of General Education information will improve Description: Including pain rating scale, medication(s)/side effects and non-pharmacologic comfort measures Outcome: Progressing   Problem: Health Behavior/Discharge Planning: Goal: Ability to manage health-related needs will improve Outcome: Progressing   Problem: Clinical Measurements: Goal: Ability to maintain clinical measurements within normal limits will improve Outcome: Progressing Goal: Cardiovascular complication will be avoided Outcome: Progressing   Problem: Activity: Goal: Risk for activity intolerance will decrease Outcome: Progressing   Problem: Nutrition: Goal: Adequate nutrition will be maintained Outcome: Progressing   Problem: Coping: Goal: Level of anxiety will decrease Outcome: Progressing

## 2020-01-29 NOTE — Progress Notes (Signed)
[facility] ([facility]) Hospital Liaison note.    Received request from TOC manager for family interest in [facility]. [facility] is unable to offer a room today. Hospital Liaison will follow up tomorrow or sooner if a room becomes available.   Please do not hesitate to call with questions.   T hank you for the opportunity to participate in this patient's care.  Chrislyn King, BSN, RN [facility] Hospital Liaison (listed on [facility] under Hospice/[facility])    [phone]     

## 2020-01-29 NOTE — Consult Note (Signed)
Consultation Note Date: 01/29/2020   Patient Name: Ralph Hardin  DOB: 1940/06/27  MRN: [id number]  Age / Sex: 79 y.o., male   PCP: Patient, No Pcp Per Referring Physician: Zenia Resides, MD   REASON FOR CONSULTATION:Establishing goals of care  Palliative Care consult requested for goals of care discussion in this 79 y.o. male with unknown medical history. Patient reports he does not generally seek medical attention. He presented to the ED from home with rectal pain, generalized weakness, and weight loss. He denies regular medication or medical visits. Abdominal CT showed large anorectal mass consistent with malignancy with concerns for enlarged lymph nodes, metastatic disease. He has been evaluated by GI and patient declined further work-up or interventions.      Clinical Assessment and Goals of Care: I have reviewed medical records including lab results, imaging, Epic notes, and MAR, received report from the bedside RN, and assessed the patient. I met at the bedside with Mr. Weekes to discuss diagnosis prognosis, [place], EOL wishes, disposition and options.  I introduced Palliative Medicine as specialized medical care for people living with serious illness. It focuses on providing relief from the symptoms and stress of a serious illness. The goal is to improve quality of life for both the patient and the family. He verbalized understanding and appreciation.   He is awake, alert & oriented x3. Complains of some abdominal pain at times.   We discussed a brief life review of the patient, along with his functional and nutritional status. Hadley reports he lives alone. He has never married or had any children. He has 1 brother and 2 sisters however they have spoken in some time and do not have a close relationship per patient. He is a retired Furniture conservator/restorer. He is of [place] faith and relies on his Doristine Bosworth for most of his support.   Prior to admission patient reports poor appetite with  approximately a 50-60 lb weight loss over the past year (unintentionally). He states he is only able to drink liquid consistency through a straw or the food feels stuck or dislodged in his lower esophagus and/or stomach. He reports increased fatigue and weakness, barely being able to walk through his home without stopping to rest. He uses home delivery for needed grocery items. He reports he does not see a physician regular and does not take regular medications.   We discussed His current illness and what it means in the larger context of His on-going co-morbidities. With specific discussions regarding his newly diagnosed cancer and overall decline in his functional and nutritional state.  Natural disease trajectory and expectations at EOL were discussed.   Leelynn verbalizes his understanding of his current illness and poor prognosis. He was able to summarize his current plan of care and openly expresses his wishes for no aggressive interventions or work-up. He reports his goal is to be comfortable and in the least amount of pain as possible during what time that he has left.   He states "I know the Lord and I am ready to see him when it is time!" Therapeutic listening and support provided.   I attempted to elicit values and goals of care important to the patient.    The difference between aggressive medical intervention and comfort care was considered in light of the patient's goals of care. Education provided to patient on what comfort care measures would look like. Mr. Soto verbalized understanding and appreciation of the discussion.    Kyel verbalizes his  wishes to be kept comfortable and in the least amount of pain as possible. He expresses wishes to return home with hospice support, however he does not have anyone who can stay with him 24/7 and provide the care that he needs.   His [name] is now present to visit. Mr. Adorno allows me to discuss his condition and plans with him. Ron  reports patient does not have the support of anyone and agrees it is unsafe for him to return home alone. Gabriella is tearful expressing his wishes to pass away in his home but understands care needs.   He confirms wishes for DNR and full comfort. Goals are clear he does not want any further work-up. Patient does not have a documented advanced directive. He reports when he dies he wishes for the funeral home to be contacted directly which is [employer] (owned by his family). He states to myself and Ron to contact them when he passes as all arrangements have been made. He reports he does have a will expressing arrangements for his assets.   Hospice services outpatient were explained and offered. Patient and his Pastorn verbalized their understanding and awareness of hospice's goals and philosophy of care. Mr. Thueson confirms wishes for hospice support, full comfort, and consideration for hospice facility in [place] if he does not pass away while hospitalized.   Questions and concerns were addressed. Patient and Doristine Bosworth was encouraged to call with questions or concerns.  PMT will continue to support holistically.   SOCIAL HISTORY:     reports that he has never smoked. He has never used smokeless tobacco. He reports that he does not drink alcohol and does not use drugs.  CODE STATUS: DNR  ADVANCE DIRECTIVES: Primary Decision Maker: Patient reports his pastor, Chriss Czar is his Scientist, research (medical).    SYMPTOM MANAGEMENT: see below   Palliative Prophylaxis:   Aspiration, Frequent Pain Assessment, Oral Care and Turn Reposition  PSYCHO-SOCIAL/SPIRITUAL:  Support System: Family  Desire for further Chaplaincy support:No  Additional Recommendations (Limitations, Scope, Preferences):  Full Comfort Care  Education on hospice   PAST MEDICAL HISTORY:History reviewed. No pertinent past medical history.  ALLERGIES:  has No Known Allergies.   MEDICATIONS:  Current  Facility-Administered Medications  Medication Dose Route Frequency Provider Last Rate Last Admin  . acetaminophen (TYLENOL) tablet 650 mg  650 mg Oral Q6H PRN Milus Banister C, DO       Or  . acetaminophen (TYLENOL) suppository 650 mg  650 mg Rectal Q6H PRN Daisy Floro, DO      . antiseptic oral rinse (BIOTENE) solution 15 mL  15 mL Topical PRN Pickenpack-Cousar, Hulda Reddix N, NP      . glycopyrrolate (ROBINUL) injection 0.2 mg  0.2 mg Intravenous Q4H PRN Pickenpack-Cousar, Hogan Hoobler N, NP      . lip balm (CARMEX) ointment   Topical PRN Daisy Floro, DO      . LORazepam (ATIVAN) injection 0.5 mg  0.5 mg Intravenous Q4H PRN Pickenpack-Cousar, Demetress Tift N, NP      . morphine 2 MG/ML injection 2 mg  2 mg Intravenous Q2H PRN Pickenpack-Cousar, Tobby Fawcett N, NP      . ondansetron (ZOFRAN) tablet 4 mg  4 mg Oral Q6H PRN Milus Banister C, DO       Or  . ondansetron (ZOFRAN) injection 4 mg  4 mg Intravenous Q6H PRN Milus Banister C, DO      . oxyCODONE (Oxy IR/ROXICODONE) immediate release tablet 5 mg  5 mg Oral Q4H PRN Milus Banister C, DO      . polyethylene glycol (MIRALAX / GLYCOLAX) packet 17 g  17 g Oral Daily PRN Milus Banister C, DO      . polyvinyl alcohol (LIQUIFILM TEARS) 1.4 % ophthalmic solution 1 drop  1 drop Both Eyes QID PRN Pickenpack-Cousar, Carlena Sax, NP      . senna (SENOKOT) tablet 8.6 mg  1 tablet Oral BID PRN Daisy Floro, DO      . sodium phosphate (FLEET) 7-19 GM/118ML enema 1 enema  1 enema Rectal Daily PRN Daisy Floro, DO        VITAL SIGNS: BP 115/83 (BP Location: Right Arm)   Pulse 60   Temp 98 F (36.7 C) (Oral)   Resp 15   Ht 5' 7"  (1.702 m)   Wt 44.5 kg   SpO2 100%   BMI 15.35 kg/m  Filed Weights   01/28/20 1409  Weight: 44.5 kg    Estimated body mass index is 15.35 kg/m as calculated from the following:   Height as of this encounter: 5' 7"  (1.702 m).   Weight as of this encounter: 44.5 kg.  LABS: CBC:    Component Value  Date/Time   WBC 8.0 01/29/2020 0317   HGB 7.3 (L) 01/29/2020 0317   HCT 24.9 (L) 01/29/2020 0317   PLT 420 (H) 01/29/2020 0317   Comprehensive Metabolic Panel:    Component Value Date/Time   NA 134 (L) 01/29/2020 0317   K 5.1 01/29/2020 0317   BUN 76 (H) 01/29/2020 0317   CREATININE 1.47 (H) 01/29/2020 0317   ALBUMIN 1.9 (L) 01/29/2020 0317     Review of Systems  Constitutional: Positive for activity change, appetite change, fatigue and unexpected weight change.  Gastrointestinal: Positive for abdominal pain and rectal pain.  Neurological: Positive for weakness.  Unless otherwise noted, a complete review of systems is negative.  Physical Exam General: NAD, frail , cachectic, chronically-ill appearing, poor dentation Cardiovascular: regular rate and rhythm Pulmonary: diminished bilaterally  Abdomen: soft, nontender, + bowel sounds Extremities: no joint deformities Skin: no rashes, warm and dry Neurological: A&O x3, mood appropriate    Prognosis: days to weeks in the setting of metastatic cancer, declining further work-up, poor po intake, severely malnourished, cachectic, 15kg, deconditioned, aspiration pna, anemia, AKI.   Discharge Planning:  Hospice facility  Recommendations: . DNR/DNI-as confirmed/requested by patient . Full Comfort care measures . Will d/c orders/interventions not comfort focused Morphine PRN for pain/air hunger/comfort Robinul PRN for excessive secretions Ativan PRN for agitation/anxiety Zofran PRN for nausea Liquifilm tears PRN for dry eyes May have comfort feeding Unrestricted visitations in the setting of EOL (per policy) Oxygen PRN 2L or less for comfort. No escalation.  Marland Kitchen Residential hospice placement as requested (TOC referral placed to arrange). Patient requesting [facility] with his [name] as Emergency contact.  Marland Kitchen PMT will continue to support and follow. Please call team line with urgent needs.   Palliative Performance Scale: PPS  20%              Patient and [name] expressed understanding and was in agreement with this plan.   Thank you for allowing the Palliative Medicine Team to assist in the care of this patient.  Time In: 1210 Time Out: 1315 Time Total: 65 min.   Visit consisted of counseling and education dealing with the complex and emotionally intense issues of symptom management and palliative care in the setting of  serious and potentially life-threatening illness.Greater than 50%  of this time was spent counseling and coordinating care related to the above assessment and plan.  Signed by:  Alda Lea, AGPCNP-BC Palliative Medicine Team  Phone: [phone] Pager: [phone] Amion: Bjorn Pippin

## 2020-01-29 NOTE — Progress Notes (Signed)
Family Medicine Teaching Service Daily Progress Note Intern Pager: [phone]  Patient name: Ralph Hardin Medical record number: [id number] Date of birth: [birth year] Age: 79 y.o. Gender: male  Primary Care Provider: Patient, No Pcp Per Consultants: GI, Palliative care  Code Status: DNR   Pt Overview and Major Events to Date:  Admitted 8/10   Assessment and Plan: THERIN Hardin is a 79 y.o. 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 Unknown PMH as patient has not followed with doctor in many years.Patient requests for no interventions and prefers comfort care.    Palliative consult to further discuss goals of care  - patient wishes to have comfort care only and declines all medical interventions -  Interested in [facility]  - recommendations per palliative care:   DNR/DNI-as confirmed/requested by patient  Full Comfort care measures  Will d/c orders/interventions not comfort focused  Morphine PRN for pain/air hunger/comfort  Robinul PRN for excessive secretions  Ativan PRN for agitation/anxiety  Zofran PRN for nausea  Liquifilm tears PRN for dry eyes  Lip balm for chapped lips   May have comfort feeding  Unrestricted visitations in the setting of EOL (per policy)  Oxygen PRN 2L or less for comfort. No escalation.   Residential hospice placement as requested (TOC referral placed to arrange). Patient requesting [facility] with his Ralph Hardin as Emergency contact.   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 Has never had a colonoscopy. Patient presenting with 50lb unintentional weight loss in the last year, increased weakness, lethargy and enlarging rectal mass. CT abd/pelvis unfortunately shows large anorectal mass  consistent with malignancy. There is concern for local invasion since mass is inseparable from the posterior wall of the prostate gland. Additionally, enlarged presacral lymph node, consistent with nodal metastatic disease and possible hepatic mets. Patient is frail and cachectic on exam with temporal waisting. Patient does not have any family in the area, would like his pastor to be primary contact.  - Admit to med-surg, attending Dr. Andria Frames  - Pain regimen: Tylenol 650 mg q6h, Oxy 5 mg q4h, Morphine 2 mg IV q4h PRN for severe pain  Acute Hypoxic Respiratory Failure 2/2 Aspiration PNA  Patient lives alone, has not been eating or drinking much. CT chest with evidence of aspiration. Required supplemental oxygen in ED, up to 6L [place] for desaturations to 87%. Weaned in room back down to 3L [place]. Has non-productive cough. Unasyn started in ED.   Symptomatic Anemia  Hgb 8.4 yesterday, down to 7.8 today. Patient notes occasional dark red blood in his stools. Feels increasingly weak and tired. Notes dyspnea on exertion. GI consulted in ED. They have reviewed CT scan and plan for potential flexible sigmoidoscopy tomorrow. Patient does not want an intervention other than comfort care. Palliative consult to establish goals of care.  Electrolyte Derrangements   Possible Pre-renal AKI  Na 132, Potassium 5.7. Creatinine 1.72. Unknown baseline. Has been receiving fluids in ED. GFR 38, BUN 82. In the setting of dehydration and reduced PO intake. Suspect hypovolemic hyponatremia from dehydration and extrarenal losses 2/2 GI bleed. Hyperkalemia could be a result of tissue catabolism from malignancy or arterial blood volume depletion. Pre-renal AKI     FEN/GI: Regular diet  Prophylaxis: None per patient's request   Disposition:  Discharge to [facility] place  Subjective:   I talked to Ralph Hardin with Dr.Hensel and patient discussed that he did not want any medical interventions and comfort care only.  He stated he had  a hard life and was content with dying.  Stated that he would prefer to die in his home even though he does live alone.  He stated he would want somebody to just check in to see if he was still alive.  He states that he believes in God and that there is a place for him after this.  Objective: Temp:  [97.2 F (36.2 C)-98.3 F (36.8 C)] 98.2 F (36.8 C) (08/12 0300) Pulse Rate:  [65-84] 69 (08/12 0300) Resp:  [10-28] 16 (08/12 0300) BP: (78-131)/(44-98) 129/68 (08/12 0300) SpO2:  [85 %-100 %] 98 % (08/12 0300) Weight:  [44.5 kg] 44.5 kg (08/11 1409) Physical Exam: General: Severely malnourished, cachectic Cardiovascular: RRR. No murmurs, rubs, or gallops Respiratory: CTA bilaterally. No wheezes, rales, rhonchi Abdomen: flat, non distended Extremities: No edema. Distal pulses 2+ bilaterally  Large mass extending from rectum. Sores on bottom   Laboratory: Recent Labs  Lab 01/27/20 2133 01/28/20 1355 01/29/20 0317  WBC 9.7 8.6 8.0  HGB 8.4* 7.8* 7.3*  HCT 29.3* 28.1* 24.9*  PLT 587* 474* 420*   Recent Labs  Lab 01/27/20 2133 01/28/20 1355 01/29/20 0317  NA 128* 132* 134*  K 5.7* 5.9* 5.1  CL 96* 102 107  CO2 13* 11* 15*  BUN 72* 82* 76*  CREATININE 1.89* 1.72* 1.47*  CALCIUM 9.2 8.7* 8.1*  PROT 7.5 7.0 6.2*  BILITOT 0.8 0.8 0.5  ALKPHOS 116 105 91  ALT 24 24 24   AST 31 32 32  GLUCOSE 109* 95 84    Imaging/Diagnostic Tests:  CT Abdomen Pelvis Wo Contrast . Large anorectal mass circumferentially involving the rectum and extending through the anus, measuring approximately 8.3 x 7.8 x 11.4 cm, consistent with malignancy. The mass is inseparable from the posterior wall of the prostate gland, concerning for local invasion. 2. Enlarged presacral lymph node, consistent with nodal metastatic disease. 3. Ill-defined hypodense lesion in the hepatic dome measuring approximately 3.0 cm, concerning for hepatic metastatic disease. Additional ill-defined areas of hypodensity in the  inferior right hepatic lobe are less well-defined. 4. Centrilobular ground-glass and tree-in-bud nodularity in the left greater than right lower lobes with associated tracheal and bronchial secretions, consistent with aspiration. 5. Aortic Atherosclerosis (ICD10-I70.0).  CT Chest Wo Contrast  IMPRESSION: 1. Large anorectal mass circumferentially involving the rectum and extending through the anus, measuring approximately 8.3 x 7.8 x 11.4 cm, consistent with malignancy. The mass is inseparable from the posterior wall of the prostate gland, concerning for local invasion. 2. Enlarged presacral lymph node, consistent with nodal metastatic disease. 3. Ill-defined hypodense lesion in the hepatic dome measuring approximately 3.0 cm, concerning for hepatic metastatic disease. Additional ill-defined areas of hypodensity in the inferior right hepatic lobe are less well-defined. 4. Centrilobular ground-glass and tree-in-bud nodularity in the left greater than right lower lobes with associated tracheal and bronchial secretions, consistent with aspiration. 5. Aortic Atherosclerosis (ICD10-I70.0).  DG Chest Port 1 View No acute process in the chest.   Theone Stanley, DO 01/29/2020, 6:59 AM PGY-1, [place] Intern pager: [phone], text pages welcome

## 2020-01-29 NOTE — Consult Note (Signed)
Referring Provider: Janetta Hora, PA-C Primary Care Physician:  Patient, No Pcp Per Primary Gastroenterologist:  Althia Forts  Reason for Consultation:  Anorectal mass  HPI: Ralph Hardin is a 79 y.o. male with unknown past medical history (patient has not seen a provider in many years) presenting for consultation of anorectal mass.  Patient states that he has been gradually losing weight over approximately 2 years.  He believes he has lost about 50 pounds in the last year alone.  He states he has a very poor appetite and does not eat much.  States he rarely has bowel movements, and when he does, they are often associated with incontinence.  Sometimes stools are soft, sometimes they are "pellets."  He has seen darker stool on occasion.  He has noticed soft tissue protrusion from the rectum, though he states he thought these were hemorrhoids.  Denies any hematochezia, abdominal pain, nausea, vomiting, dysphagia.  Denies any family history of colon cancer or gastrointestinal malignancy.  Rarely drinks alcohol.  Denies NSAID, aspirin, or blood thinner use.  He has never had an EGD or colonoscopy.  History reviewed. No pertinent past medical history.  History reviewed. No pertinent surgical history.  Prior to Admission medications   Not on File    Scheduled Meds: Continuous Infusions: PRN Meds:.acetaminophen **OR** acetaminophen, lip balm, morphine injection, ondansetron **OR** ondansetron (ZOFRAN) IV, oxyCODONE, polyethylene glycol, senna, sodium phosphate  Allergies as of 01/27/2020   (No Known Allergies)    History reviewed. No pertinent family history.  Social History   Socioeconomic History   Marital status: Single    Spouse name: Not on file   Number of children: Not on file   Years of education: Not on file   Highest education level: Not on file  Occupational History   Not on file  Tobacco Use   Smoking status: Never Smoker   Smokeless tobacco: Never Used   Substance and Sexual Activity   Alcohol use: Never   Drug use: Never   Sexual activity: Not on file  Other Topics Concern   Not on file  Social History Narrative   Not on file   Social Determinants of Health   Financial Resource Strain:    Difficulty of Paying Living Expenses:   Food Insecurity:    Worried About [place] in the Last Year:    Arboriculturist in the Last Year:   Transportation Needs:    Film/video editor (Medical):    Lack of Transportation (Non-Medical):   Physical Activity:    Days of Exercise per Week:    Minutes of Exercise per Session:   Stress:    Feeling of Stress :   Social Connections:    Frequency of Communication with Friends and Family:    Frequency of Social Gatherings with Friends and Family:    Attends Religious Services:    Active Member of Clubs or Organizations:    Attends Music therapist:    Marital Status:   Intimate Partner Violence:    Fear of Current or Ex-Partner:    Emotionally Abused:    Physically Abused:    Sexually Abused:     Review of Systems: Review of Systems  Constitutional: Positive for malaise/fatigue and weight loss. Negative for chills and fever.  HENT: Negative for sore throat.   Eyes: Negative for pain and redness.  Respiratory: Negative for cough, shortness of breath and stridor.   Cardiovascular: Negative for chest pain and palpitations.  Gastrointestinal: Positive for constipation, diarrhea and melena. Negative for abdominal pain, blood in stool, heartburn, nausea and vomiting.  Genitourinary: Negative for flank pain and hematuria.  Musculoskeletal: Negative for falls and joint pain.  Skin: Negative for itching and rash.  Neurological: Negative for seizures and loss of consciousness.  Endo/Heme/Allergies: Negative for polydipsia. Does not bruise/bleed easily.  Psychiatric/Behavioral: Negative for substance abuse. The patient is not nervous/anxious.      Physical Exam: Vital signs: Vitals:   01/29/20 0300 01/29/20 0814  BP: 129/68 115/83  Pulse: 69 60  Resp: 16 15  Temp: 98.2 F (36.8 C) 98 F (36.7 C)  SpO2: 98% 100%   Last BM Date: 01/28/20 Physical Exam Constitutional:      General: He is not in acute distress.    Appearance: He is cachectic.  HENT:     Head: Normocephalic and atraumatic.     Nose: Nose normal.     Comments: Nasal cannula in place    Mouth/Throat:     Mouth: Mucous membranes are moist.     Pharynx: Oropharynx is clear.     Comments: Poor dentition Eyes:     General: No scleral icterus.    Extraocular Movements: Extraocular movements intact.  Cardiovascular:     Rate and Rhythm: Normal rate and regular rhythm.     Pulses: Normal pulses.     Heart sounds: Normal heart sounds.  Pulmonary:     Effort: Pulmonary effort is normal.     Breath sounds: Normal breath sounds.  Abdominal:     General: Abdomen is flat. Bowel sounds are normal. There is no distension.     Palpations: Abdomen is soft.     Tenderness: There is no abdominal tenderness. There is no guarding or rebound.     Hernia: No hernia is present.  Musculoskeletal:        General: No swelling or tenderness.     Cervical back: Normal range of motion and neck supple.  Skin:    General: Skin is warm and dry.  Neurological:     General: No focal deficit present.     Mental Status: He is oriented to person, place, and time. He is lethargic.  Psychiatric:        Mood and Affect: Mood normal.        Behavior: Behavior normal.      GI:  Lab Results: Recent Labs    01/27/20 2133 01/28/20 1355 01/29/20 0317  WBC 9.7 8.6 8.0  HGB 8.4* 7.8* 7.3*  HCT 29.3* 28.1* 24.9*  PLT 587* 474* 420*   BMET Recent Labs    01/27/20 2133 01/28/20 1355 01/29/20 0317  NA 128* 132* 134*  K 5.7* 5.9* 5.1  CL 96* 102 107  CO2 13* 11* 15*  GLUCOSE 109* 95 84  BUN 72* 82* 76*  CREATININE 1.89* 1.72* 1.47*  CALCIUM 9.2 8.7* 8.1*    LFT Recent Labs    01/29/20 0317  PROT 6.2*  ALBUMIN 1.9*  AST 32  ALT 24  ALKPHOS 91  BILITOT 0.5   PT/INR Recent Labs    01/27/20 2133  LABPROT 14.3  INR 1.2     Studies/Results: CT Abdomen Pelvis Wo Contrast  Result Date: 01/28/2020 CLINICAL DATA:  Rectal mass and bleeding.  Cough. EXAM: CT CHEST, ABDOMEN AND PELVIS WITHOUT CONTRAST TECHNIQUE: Multidetector CT imaging of the chest, abdomen and pelvis was performed following the standard protocol without IV contrast. COMPARISON:  Chest x-ray from same day. FINDINGS: CT  CHEST FINDINGS Cardiovascular: No significant vascular findings. Normal heart size. No pericardial effusion. No thoracic aortic aneurysm. Coronary, aortic arch, and branch vessel atherosclerotic vascular disease. Mediastinum/Nodes: No enlarged mediastinal, hilar, or axillary lymph nodes. Thyroid gland and esophagus demonstrate no significant findings. Lungs/Pleura: Secretions in the lower trachea and both mainstem and lower lobe bronchi. Centrilobular ground-glass and tree-in-bud nodularity in the left greater than right lower lobes. No pleural effusion or pneumothorax. No suspicious pulmonary nodule. Musculoskeletal: No chest wall mass or suspicious bone lesions identified. CT ABDOMEN PELVIS FINDINGS Hepatobiliary: Ill-defined hypodense lesion in the hepatic dome measuring approximately 3.0 cm (series 3, image 56). Additional ill-defined areas of hypodensity in the inferior right hepatic lobe are less well-defined. Gallbladder is unremarkable. No biliary dilatation. Pancreas: Unremarkable. No pancreatic ductal dilatation or surrounding inflammatory changes. Spleen: Small in size with multiple calcified granulomas. Adrenals/Urinary Tract: Adrenal glands are unremarkable. Kidneys are normal, without renal calculi, focal lesion, or hydronephrosis. Bladder is unremarkable. Stomach/Bowel: Large mass circumferentially involving the rectum and extending through the anus,  measuring approximately 8.3 x 7.8 x 11.4 cm (AP by transverse by CC). The mass is inseparable from the posterior wall of the prostate gland. The remaining colon is unremarkable. The stomach is within normal limits. No obstruction. Normal appendix. Vascular/Lymphatic: Enlarged presacral lymph node measuring 1.7 cm in short axis (series 3, image 107). No additional enlarged abdominal or pelvic lymph nodes. Aortoiliac atherosclerotic vascular disease. Reproductive: Mild prostatomegaly. Other: No free fluid or pneumoperitoneum. Musculoskeletal: No acute or significant osseous findings. IMPRESSION: 1. Large anorectal mass circumferentially involving the rectum and extending through the anus, measuring approximately 8.3 x 7.8 x 11.4 cm, consistent with malignancy. The mass is inseparable from the posterior wall of the prostate gland, concerning for local invasion. 2. Enlarged presacral lymph node, consistent with nodal metastatic disease. 3. Ill-defined hypodense lesion in the hepatic dome measuring approximately 3.0 cm, concerning for hepatic metastatic disease. Additional ill-defined areas of hypodensity in the inferior right hepatic lobe are less well-defined. 4. Centrilobular ground-glass and tree-in-bud nodularity in the left greater than right lower lobes with associated tracheal and bronchial secretions, consistent with aspiration. 5. Aortic Atherosclerosis (ICD10-I70.0). Electronically Signed   By: Titus Dubin M.D.   On: 01/28/2020 15:09   CT Chest Wo Contrast  Result Date: 01/28/2020 CLINICAL DATA:  Rectal mass and bleeding.  Cough. EXAM: CT CHEST, ABDOMEN AND PELVIS WITHOUT CONTRAST TECHNIQUE: Multidetector CT imaging of the chest, abdomen and pelvis was performed following the standard protocol without IV contrast. COMPARISON:  Chest x-ray from same day. FINDINGS: CT CHEST FINDINGS Cardiovascular: No significant vascular findings. Normal heart size. No pericardial effusion. No thoracic aortic aneurysm.  Coronary, aortic arch, and branch vessel atherosclerotic vascular disease. Mediastinum/Nodes: No enlarged mediastinal, hilar, or axillary lymph nodes. Thyroid gland and esophagus demonstrate no significant findings. Lungs/Pleura: Secretions in the lower trachea and both mainstem and lower lobe bronchi. Centrilobular ground-glass and tree-in-bud nodularity in the left greater than right lower lobes. No pleural effusion or pneumothorax. No suspicious pulmonary nodule. Musculoskeletal: No chest wall mass or suspicious bone lesions identified. CT ABDOMEN PELVIS FINDINGS Hepatobiliary: Ill-defined hypodense lesion in the hepatic dome measuring approximately 3.0 cm (series 3, image 56). Additional ill-defined areas of hypodensity in the inferior right hepatic lobe are less well-defined. Gallbladder is unremarkable. No biliary dilatation. Pancreas: Unremarkable. No pancreatic ductal dilatation or surrounding inflammatory changes. Spleen: Small in size with multiple calcified granulomas. Adrenals/Urinary Tract: Adrenal glands are unremarkable. Kidneys are normal, without renal calculi, focal lesion,  or hydronephrosis. Bladder is unremarkable. Stomach/Bowel: Large mass circumferentially involving the rectum and extending through the anus, measuring approximately 8.3 x 7.8 x 11.4 cm (AP by transverse by CC). The mass is inseparable from the posterior wall of the prostate gland. The remaining colon is unremarkable. The stomach is within normal limits. No obstruction. Normal appendix. Vascular/Lymphatic: Enlarged presacral lymph node measuring 1.7 cm in short axis (series 3, image 107). No additional enlarged abdominal or pelvic lymph nodes. Aortoiliac atherosclerotic vascular disease. Reproductive: Mild prostatomegaly. Other: No free fluid or pneumoperitoneum. Musculoskeletal: No acute or significant osseous findings. IMPRESSION: 1. Large anorectal mass circumferentially involving the rectum and extending through the anus,  measuring approximately 8.3 x 7.8 x 11.4 cm, consistent with malignancy. The mass is inseparable from the posterior wall of the prostate gland, concerning for local invasion. 2. Enlarged presacral lymph node, consistent with nodal metastatic disease. 3. Ill-defined hypodense lesion in the hepatic dome measuring approximately 3.0 cm, concerning for hepatic metastatic disease. Additional ill-defined areas of hypodensity in the inferior right hepatic lobe are less well-defined. 4. Centrilobular ground-glass and tree-in-bud nodularity in the left greater than right lower lobes with associated tracheal and bronchial secretions, consistent with aspiration. 5. Aortic Atherosclerosis (ICD10-I70.0). Electronically Signed   By: Titus Dubin M.D.   On: 01/28/2020 15:09   DG Chest Port 1 View  Result Date: 01/28/2020 CLINICAL DATA:  Shortness of breath EXAM: PORTABLE CHEST 1 VIEW COMPARISON:  None. FINDINGS: There is no consolidation or edema. No pleural effusion or pneumothorax. Cardiomediastinal contours are within normal limits with normal heart size. IMPRESSION: No acute process in the chest. Electronically Signed   By: Macy Mis M.D.   On: 01/28/2020 14:02    Impression: Anorectal mass, most likely malignant, with evidence of metastatic disease. -CT 01/28/20: Large anorectal mass circumferentially involving the rectum and extending through the anus, measuring approximately 8.3 x 7.8 x 11.4 cm, consistent with malignancy. The mass is inseparable from the posterior wall of the prostate gland, concerning for local invasion. Enlarged presacral lymph node, consistent with nodal metastatic disease.  Ill-defined hypodense lesion in the hepatic dome measuring approximately 3.0 cm, concerning for hepatic metastatic disease. Additional ill-defined areas of hypodensity in the inferior right hepatic lobe are less well-defined. -Microcytic anemia: Hgb 7.2 today, as compared to Hgb 8.4 yesterday -Normal  LFTs  Plan: Recommended proceeding with flex sig today for direct visualization, as well as biopsy for tissue diagnosis.  Informed patient that this appears to be a malignant mass with metastasis.  I thoroughly discussed the benefits and risks of flexible sigmoidoscopy with the patient and advised him that tissue diagnosis is needed for consideration of any therapy.  Despite a long discussion, patient refused to proceed with flex sig today.  He stated "I am not afraid of dying."  Recommend IR guided liver biopsy, as this is likely to provide the diagnosis.  Poor prognosis.  Agree with palliative care consultation.  Continue supportive care.  Continue to monitor H&H with transfusion as needed to maintain hemoglobin greater than 7.   LOS: 1 day   Salley Slaughter  PA-C 01/29/2020, 10:07 AM  Contact #  [phone]

## 2020-01-29 NOTE — Progress Notes (Signed)
Nutrition Brief Note  Chart reviewed. Pt now transitioning to comfort care.  No further nutrition interventions warranted at this time.  Please re-consult as needed.   Tranisha Tissue W, RD, LDN, CDCES Registered Dietitian II Certified Diabetes Care and Education Specialist Please refer to [facility] for RD and/or RD on-call/weekend/after hours pager  

## 2020-01-30 DIAGNOSIS — G893 Neoplasm related pain (acute) (chronic): Secondary | ICD-10-CM | POA: Diagnosis not present

## 2020-01-30 DIAGNOSIS — Z515 Encounter for palliative care: Secondary | ICD-10-CM

## 2020-01-30 DIAGNOSIS — Z66 Do not resuscitate: Secondary | ICD-10-CM

## 2020-01-30 DIAGNOSIS — Z789 Other specified health status: Secondary | ICD-10-CM

## 2020-01-30 DIAGNOSIS — K6289 Other specified diseases of anus and rectum: Secondary | ICD-10-CM | POA: Diagnosis not present

## 2020-01-30 DIAGNOSIS — J690 Pneumonitis due to inhalation of food and vomit: Secondary | ICD-10-CM | POA: Diagnosis not present

## 2020-01-30 DIAGNOSIS — R531 Weakness: Secondary | ICD-10-CM

## 2020-01-30 MED ORDER — MORPHINE SULFATE (PF) 2 MG/ML IV SOLN
2.0000 mg | INTRAVENOUS | Status: DC
  Administered 2020-01-30 – 2020-01-31 (×9): 2 mg via INTRAVENOUS
  Filled 2020-01-30 (×9): qty 1

## 2020-01-30 MED ORDER — MORPHINE SULFATE (PF) 2 MG/ML IV SOLN: 1.0000 mg | INTRAVENOUS | Status: DC | PRN

## 2020-01-30 NOTE — Progress Notes (Signed)
Manufacturing engineer [facility])  [facility] does not have a bed to offer Mr. Varady today.  [facility] will update family and TOC once there is a bed.  Thank you, Venia Carbon RN, BSN, [facility] Liaison

## 2020-01-30 NOTE — Progress Notes (Signed)
Family Medicine Teaching Service Daily Progress Note Intern Pager: [phone]  Patient name: Ralph Hardin Medical record number: [id number] Date of birth: 1940-08-09 Age: 79 y.o. 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 Patient requests for no interventions and prefers comfort care.  Hospital liaison working to find an available bed at [facility], and patient is medically cleared to go when one is available.   Palliative consult to further discuss goals of care  - patient wishes to have comfort care only and declines all medical interventions -  Interested in [facility]   Failure to Thrive 2/2likely anorectalcarcinoma with metastasis Patient has not regularly followed up with a primary care physician, takes no medications regularly. Has never had a colonoscopy. Patient presenting with 50lb unintentional weight loss in the last year, increased weakness, lethargy and enlarging rectal mass. CT abd/pelvis unfortunately shows large anorectal mass consistent with malignancy.There is concern for local invasion sincemass is inseparable from the posterior wall of the prostate gland.Additionally, enlarged presacral lymph node, consistent with nodal metastatic diseaseand possible hepatic mets. Patient is frail and cachectic on exam with temporal waisting. Patient does not have any family in the area, would like his pastor to be primary contact.  - Admit to med-surg, attending Dr. Andria Frames   - Pain regimen: Morphine 2 mg IV q3h  Acute Hypoxic Respiratory Failure 2/2 Aspiration PNA  Patient lives alone, has not been eating or drinking  much. CT chest with evidence of aspiration. Required supplemental oxygen in ED, up to 6L [place] for desaturations to 87%. Weaned in room back down to 3L [place]. Has non-productive cough. Unasyn started in ED.   SymptomaticAnemia Hgb 8.4 yesterday, down to 7.8today. Patient notes occasional dark red blood in his stools. Feels increasingly weak and tired. Notes dyspnea on exertion. GI consulted in ED. They have reviewed CT scan and plan for potential flexible sigmoidoscopy tomorrow. Patient does not want an intervention other than comfort care. Palliative consult to establish goals of care.   FEN/GI:Regular diet  Prophylaxis:None per patient's request   Disposition:Discharge to [facility] place when bed is available    Subjective:  No acute events overnight. Patient states he wants to take his last breath and be with the [place]. He is requesting his pain medication as well.    Objective: Temp:  [98 F (36.7 C)-98.5 F (36.9 C)] 98.2 F (36.8 C) (08/13 0500) Pulse Rate:  [60-67] 62 (08/13 0500) Resp:  [15] 15 (08/13 0500) BP: (103-115)/(51-83) 105/51 (08/13 0500) SpO2:  [100 %] 100 % (08/13 0500) Physical Exam: General:severely malnourished, cachectic  Cardiovascular: RRR. No murmurs, rubs, or gallops  Respiratory: CTA bilaterally. No wheezes, rales, rhonchi  Abdomen: Flat. Normoactive bowel sounds  Extremities: No edema. Distal pulses 2+ bilaterally   Laboratory: Recent Labs  Lab 01/27/20 2133 01/28/20 1355 01/29/20 0317  WBC 9.7 8.6 8.0  HGB 8.4* 7.8* 7.3*  HCT 29.3* 28.1* 24.9*  PLT 587* 474* 420*   Recent Labs  Lab 01/27/20 2133 01/28/20 1355 01/29/20 0317  NA 128* 132* 134*  K 5.7* 5.9* 5.1  CL 96* 102 107  CO2 13* 11* 15*  BUN 72* 82* 76*  CREATININE  1.89* 1.72* 1.47*  CALCIUM 9.2 8.7* 8.1*  PROT 7.5 7.0 6.2*  BILITOT 0.8 0.8 0.5  ALKPHOS 116 105 91  ALT 24 24 24   AST 31 32 32  GLUCOSE 109* 95 84     Imaging/Diagnostic Tests:  None recent   Theone Stanley, DO 01/30/2020, 7:37 AM PGY-1, [place] Intern pager: [phone], text pages welcome

## 2020-01-30 NOTE — Plan of Care (Signed)

## 2020-01-30 NOTE — Progress Notes (Signed)
Daily Progress Note   Patient Name: Ralph Hardin       Date: 01/30/2020 DOB: Oct 20, 1940  Age: 79 y.o. MRN#: [id number] Attending Physician: Zenia Resides, MD Primary Care Physician: Patient, No Pcp Per Admit Date: 01/27/2020  Reason for Consultation/Follow-up: Non pain symptom management, Pain control, Psychosocial/spiritual support and Terminal Care  Subjective: Chart review performed. Received report from primary RN - no acute concerns. States the patient told her he was "taking his last breath today."  Went to visit patient at bedside - no visitors present. Patient was lying in bed awake, alert, and able to participate in conversation. No respiratory distress, increased work of breathing, or secretions noted. Patient denies experiencing shortness of breath but he does endorse 4/10 pain in his rectal area. He states that his current pain medication helps "some." Reviewed current active pain medication orders and encouraged to utilize medications when needed for comfort, as he is not currently utilizing per minimal frequency.   Updated patient that we are still waiting for bed availability for transfer to [facility].   Patient says he is able to take sips of drink.   Patient stated that he is "ready to go" and "ready to take his last breath." He stated "the more pain medication the better" and to "not worry about overdosing" him. Educated that we cannot hasten death, but our goal is provide comfort and relief of suffering - he expressed understanding. Emotional support and therapeutic listening provided.   Patient was agreeable to remove oxygen and treat any shortness of breath or air hunger with medications - oxygen was removed.   Length of Stay: 2  Current Medications: Scheduled  Meds:    Continuous Infusions:   PRN Meds: acetaminophen **OR** acetaminophen, antiseptic oral rinse, glycopyrrolate, lip balm, LORazepam, morphine injection, ondansetron **OR** ondansetron (ZOFRAN) IV, oxyCODONE, polyethylene glycol, polyvinyl alcohol, senna, sodium phosphate  Physical Exam Vitals and nursing note reviewed.  Constitutional:      General: He is not in acute distress.    Appearance: He is ill-appearing.  Pulmonary:     Effort: No respiratory distress.  Skin:    General: Skin is warm and dry.  Neurological:     Mental Status: He is alert.     Motor: Weakness present.  Psychiatric:  Behavior: Behavior is cooperative.        Cognition and Memory: Cognition normal.             Vital Signs: BP (!) 105/51 (BP Location: Right Arm)    Pulse 62    Temp 98.2 F (36.8 C) (Oral)    Resp 15    Ht 5\' 7"  (1.702 m)    Wt 44.5 kg    SpO2 100%    BMI 15.35 kg/m  SpO2: SpO2: 100 % O2 Device: O2 Device: Room Air O2 Flow Rate: O2 Flow Rate (L/min): 6 L/min  Intake/output summary:   Intake/Output Summary (Last 24 hours) at 01/30/2020 1119 Last data filed at 01/30/2020 0900 Gross per 24 hour  Intake 540 ml  Output 1200 ml  Net -660 ml   LBM: Last BM Date: 01/28/20 Baseline Weight: Weight: 44.5 kg Most recent weight: Weight: 44.5 kg       Palliative Assessment/Data: PPS 20%      Patient Active Problem List   Diagnosis Date Noted   Aspiration pneumonia of both lungs (HCC)    Metastatic malignant neoplasm (HCC)    Rectal mass    Severe protein-calorie malnutrition (HCC)    Failure to thrive in adult 01/28/2020    Palliative Care Assessment & Plan   Patient Profile: Palliative Care consult requested for goals of care discussion in this 79 y.o. male with unknown medical history. Patient reports he does not generally seek medical attention. He presented to the ED from home with rectal pain, generalized weakness, and weight loss. He denies regular  medication or medical visits. Abdominal CT showed large anorectal mass consistent with malignancy with concerns for enlarged lymph nodes, metastatic disease. He has been evaluated by GI and patient declined further work-up or interventions.      Assessment: Failure to thrive Aspiration pneumonia Acute hypoxic respiratory failure Symptomatic anemia Anorectal mass with metastasis Comfort measures Terminal care Generalized weakness Cancer associated pain   Recommendations/Plan: -Continue full comfort measures -Continue DNR/DNI as previously documented -Transfer to [facility] once bed becomes available. Pending bed availability, patient may become a hospital death. -Provide frequent assessments and administer PRN medications as clinically necessary to ensure EOL comfort -Oxygen discontinued, do not escalate if oxygen saturations become low. Manage respiratory symptoms with PRN medications as clinically necessary  -PMT will continue to follow holistically  Goals of Care and Additional Recommendations:  Limitations on Scope of Treatment: Full Comfort Care  Code Status:    Code Status Orders  (From admission, onward)         Start     Ordered   01/29/20 1426  Do not attempt resuscitation (DNR)  Continuous       Question Answer Comment  In the event of cardiac or respiratory ARREST Do not call a code blue   In the event of cardiac or respiratory ARREST Do not perform Intubation, CPR, defibrillation or ACLS   In the event of cardiac or respiratory ARREST Use medication by any route, position, wound care, and other measures to relive pain and suffering. May use oxygen, suction and manual treatment of airway obstruction as needed for comfort.      01/29/20 1428        Code Status History    Date Active Date Inactive Code Status Order ID Comments User Context   01/28/2020 1805 01/29/2020 1429 DNR [id number]  Daisy Floro, DO ED   01/28/2020 1434 01/28/2020 1805 DNR [id number]   Sherwood Gambler, MD  ED   Advance Care Planning Activity       Prognosis:   < 2 weeks  Discharge Planning:  Hospice facility pending bed availability at [facility], may become hospital death  Care plan was discussed with primary RN, patient  Thank you for allowing the Palliative Medicine Team to assist in the care of this patient.   Total Time 25 minutes Prolonged Time Billed  no       Greater than 50%  of this time was spent counseling and coordinating care related to the above assessment and plan.  Lin Landsman, NP  Please contact Palliative Medicine Team phone at [phone] for questions and concerns.

## 2020-01-31 DIAGNOSIS — R932 Abnormal findings on diagnostic imaging of liver and biliary tract: Secondary | ICD-10-CM

## 2020-01-31 DIAGNOSIS — K6289 Other specified diseases of anus and rectum: Secondary | ICD-10-CM | POA: Diagnosis not present

## 2020-01-31 DIAGNOSIS — G893 Neoplasm related pain (acute) (chronic): Secondary | ICD-10-CM | POA: Diagnosis not present

## 2020-01-31 DIAGNOSIS — J690 Pneumonitis due to inhalation of food and vomit: Secondary | ICD-10-CM | POA: Diagnosis not present

## 2020-01-31 NOTE — Progress Notes (Signed)
Family Medicine Teaching Service Daily Progress Note Intern Pager: [phone]  Patient name: Ralph Hardin Medical record number: [id number] Date of birth: 02/11/1941 Age: 79 y.o. 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  Patient currently on comfort care waiting for hospice placement.  Failure to thrive secondary to what is likely anorectal carcinoma with metastasis 50 pound weight loss in the last year with enlarging rectal mass.  CT consistent with malignancy.  Palliative care consulted and assisting with comfort care measures and hospice placement. -Tylenol as needed for fever/pain -2 g IV morphine every 3 hours scheduled.  5 mg oxycodone IR every 4 hours as needed for moderate pain.  1 g IV morphine 2 hours as needed for severe pain -Zofran for nausea -Glycopyrrolate every 4 hours as needed for secretions Ativan 0.5 mg every 4 hours as needed for anxiety -Fleet enema daily as needed for severe constipation -Supplemental O2 as needed -Soft diet  Acute hypoxic respiratory failure secondary to aspiration pneumonitis CT chest showed evidence of aspiration.  Patient had oxygen requirement up to 6 L nasal cannula, but his subsequently been weaned to room air. -Antibiotics stopped when patient transition to comfort care  Symptomatic anemia Secondary to chronic GI blood loss.  Hemoglobin downtrending: 8.4->7.8->7.4  FEN/GI: Soft diet.  Senna and MiraLAX PPx: None  Disposition: [facility]  Subjective:  Patient states there are no new symptoms or no worsening of symptoms or pain overnight.  He states  his pain is "well controlled".    Objective: Temp:  [98.2 F (36.8 C)-98.5 F (36.9 C)] 98.5 F (36.9 C) (08/13 1920) Pulse Rate:  [60-63] 63 (08/13 1920) Resp:  [15-17] 17 (08/13 1920) BP: (104-109)/(51-61) 109/61 (08/13 1920) SpO2:  [99 %-100 %] 99 % (08/13 1920) Physical Exam: General: Alert.  Oriented.  No acute distress severely frail and cachectic man, Cardiovascular: Regular rate and rhythm, no murmurs. Respiratory: Lungs clear to auscultation bilaterally Abdomen: Soft, nontender Extremities: No edema  Laboratory: Recent Labs  Lab 01/27/20 2133 01/28/20 1355 01/29/20 0317  WBC 9.7 8.6 8.0  HGB 8.4* 7.8* 7.3*  HCT 29.3* 28.1* 24.9*  PLT 587* 474* 420*   Recent Labs  Lab 01/27/20 2133 01/28/20 1355 01/29/20 0317  NA 128* 132* 134*  K 5.7* 5.9* 5.1  CL 96* 102 107  CO2 13* 11* 15*  BUN 72* 82* 76*  CREATININE 1.89* 1.72* 1.47*  CALCIUM 9.2 8.7* 8.1*  PROT 7.5 7.0 6.2*  BILITOT 0.8 0.8 0.5  ALKPHOS 116 105 91  ALT 24 24 24   AST 31 32 32  GLUCOSE 109* 95 84     Benay Pike, MD 01/31/2020, 2:27 AM PGY-3, [place] Intern pager: [phone], text pages welcome

## 2020-01-31 NOTE — Discharge Summary (Signed)
[facility] Discharge Summary  Patient name: Ralph Hardin Medical record number: [id number] Date of birth: 06-01-1941 Age: 79 y.o. Gender: male Date of Admission: 01/27/2020  Date of Discharge:  Admitting Physician: Zenia Resides, MD  Primary Care Provider: Patient, No Pcp Per Consultants: GI, palliative care  Indication for Hospitalization: Rectal mass, failure to thrive  Discharge Diagnoses/Problem List:  Metastatic carcinoma, Failure to thrive Acute hypoxic respiratory failure Chronic blood loss anemia  Disposition: Residential hospice  Discharge Condition: Stable  Discharge Exam:   Exam completed by Dr. Addison Naegeli General: Alert.  Oriented.  No acute distress severely frail and cachectic man, Cardiovascular: Regular rate and rhythm, no murmurs. Respiratory: Lungs clear to auscultation bilaterally Abdomen: Soft, nontender Extremities: No edema  Brief Hospital Course:  Patient was admitted on 8/11 for worsening rectal pain and constipation secondary to a large fungating mass on his rectum, as well as failure to thrive and 50 pound weight loss in the past year.  Patient does not see a doctor regularly.  In the ED, the patient was found to be hypoxic and placed on 3 L nasal cannula & anemic with a hemoglobin of 8.  CT chest showed possible aspiration pneumonia and CT abdomen pelvis showed large anorectal mass involving the rectum and anus with signs of metastasis in the nodes as well as liver. GI was initially consulted, but after talking with palliative care,  the patient decided on transitioning to comfort care with the goal of discharging to residential hospice.  Patient was put on comfort care measures with Ativan for anxiety, opioids for pain.  Patient had initially been on Unasyn, but this was stopped.  Pain regimen at the time of discharge includes:  -2 g IV morphine every 3 hours scheduled.   -5 mg oxycodone IR every 4 hours as needed  for moderate pain.  -1 g IV morphine 2 hours as needed for severe pain - Ativan 0.5 mg every 4 hours as needed for anxiety  Discharge Diet: soft diet   Issues for Follow Up:  1. Patient on comfort care and transitioned to DNR status during this admission.   Significant Procedures: None   Significant Labs and Imaging:  Recent Labs  Lab 01/27/20 2133 01/28/20 1355 01/29/20 0317  WBC 9.7 8.6 8.0  HGB 8.4* 7.8* 7.3*  HCT 29.3* 28.1* 24.9*  PLT 587* 474* 420*   Recent Labs  Lab 01/27/20 2133 01/27/20 2133 01/28/20 1355 01/29/20 0317  NA 128*  --  132* 134*  K 5.7*   < > 5.9* 5.1  CL 96*  --  102 107  CO2 13*  --  11* 15*  GLUCOSE 109*  --  95 84  BUN 72*  --  82* 76*  CREATININE 1.89*  --  1.72* 1.47*  CALCIUM 9.2  --  8.7* 8.1*  ALKPHOS 116  --  105 91  AST 31  --  32 32  ALT 24  --  24 24  ALBUMIN 2.3*  --  2.2* 1.9*   < > = values in this interval not displayed.    Results/Tests Pending at Time of Discharge: None  Discharge Medications:  Allergies as of 01/31/2020   No Known Allergies     Medication List    You have not been prescribed any medications.     Discharge Instructions: Please refer to Patient Instructions section of EMR for full details.  Patient was counseled important signs and symptoms that should prompt  return to medical care, changes in medications, dietary instructions, activity restrictions, and follow up appointments.     Eulis Foster, MD 01/31/2020, 2:59 PM PGY-2, [place]

## 2020-01-31 NOTE — TOC Transition Note (Signed)
Transition of Care [facility]) - CM/SW Discharge Note   Patient Details  Name: Ralph Hardin MRN: [id number] Date of Birth: 1940-07-31  Transition of Care [facility]) CM/SW Contact:  Gabrielle Dare Phone Number: 01/31/2020, 3:26 PM   Clinical Narrative:     Patient will Discharge To: [facility] Anticipated DC Date:01/31/20 Family Notified: Yes family friend, Alba Cory, [phone] Transport By: Corey Harold   Per MD patient ready for DC to [facility] . RN, patient, patient's family, and facility notified of DC. Assessment, Fl2/Pasrr, and Discharge Summary sent to facility. RN given number for report [phone]). DC packet on chart. Ambulance transport requested for patient.   CSW signing off.  Reed Breech [facility] [phone]      Final next level of care: Other (comment) Dance movement psychotherapist) Barriers to Discharge: No Barriers Identified   Patient Goals and CMS Choice        Discharge Placement              Patient chooses bed at:  [facility]) Patient to be transferred to facility by: EMS Name of family member notified: Alba Cory (friend) Patient and family notified of of transfer: 01/31/20  Discharge Plan and Services                                     Social Determinants of Health (SDOH) Interventions     Readmission Risk Interventions No flowsheet data found.

## 2020-01-31 NOTE — Discharge Instructions (Signed)
Dear Ralph Hardin,   Thank you so much for allowing [place] to be part of your care. You were admitted to [facility] for rectal pain and anemia due to metastatic cancer. During this admission, Mr. Reamy decided to transition to comfort care and expressed desire to change code status to DNR and pursue discharge to [facility].   Please know it was a pleasure caring for you the past few days.   [facility]  [place], [place] [address] [phone]

## 2020-01-31 NOTE — Progress Notes (Signed)
Manufacturing engineer [facility])  [facility] has a bed for Mr. Franzen today.  Will meet with the pt to complete necessary consents.  Will update TOC manager once complete so transport can be arranged.  Venia Carbon RN, BSN, [place] [facility] Liaison

## 2020-01-31 NOTE — Progress Notes (Signed)
Patient discharging to [facility] place. Report called in to [facility]. Awaiting transportation.

## 2020-02-02 LAB — CULTURE, BLOOD (ROUTINE X 2)
Culture: NO GROWTH
Culture: NO GROWTH
Special Requests: ADEQUATE

## 2020-02-18 DEATH — deceased

## 2021-06-17 IMAGING — DX DG CHEST 1V PORT
2 series · 2 of 2 positions shown · non-contrast
Comparison: None.

CLINICAL DATA: Shortness of breath

EXAM:
PORTABLE CHEST 1 VIEW

[chest ap (1 of 2)]
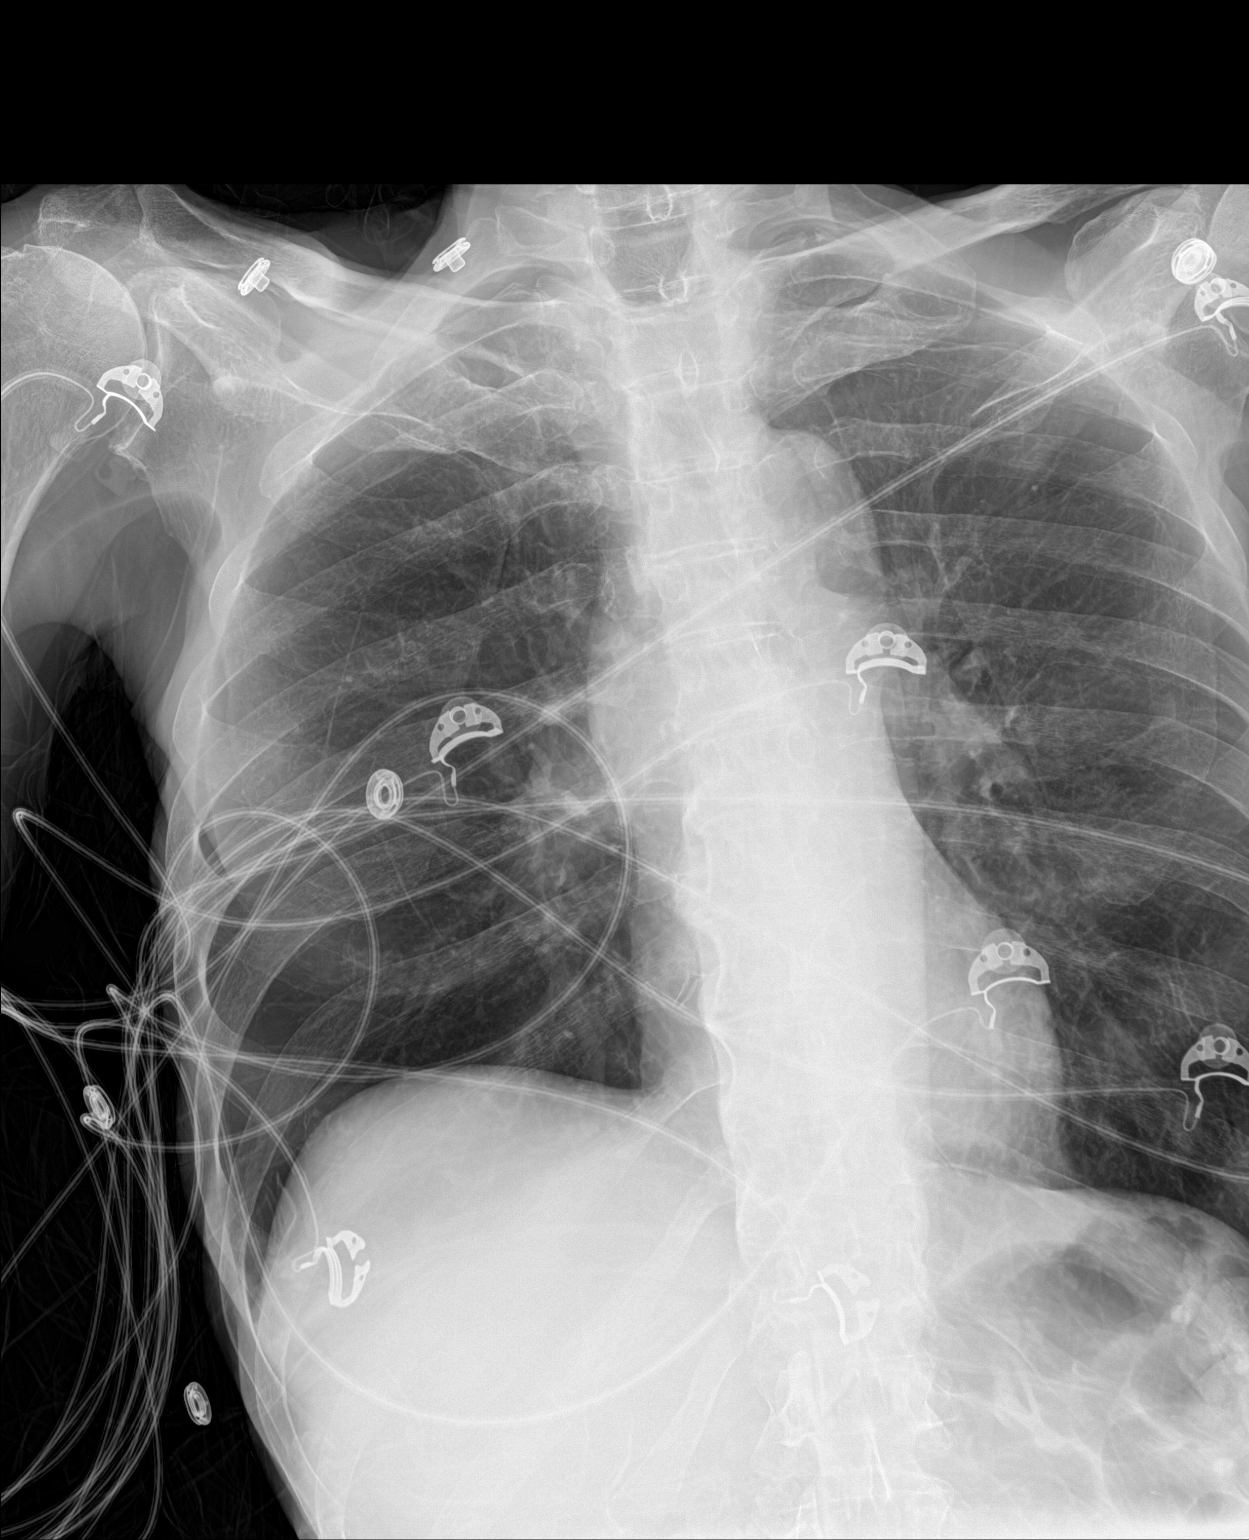

[chest ap (2 of 2)]
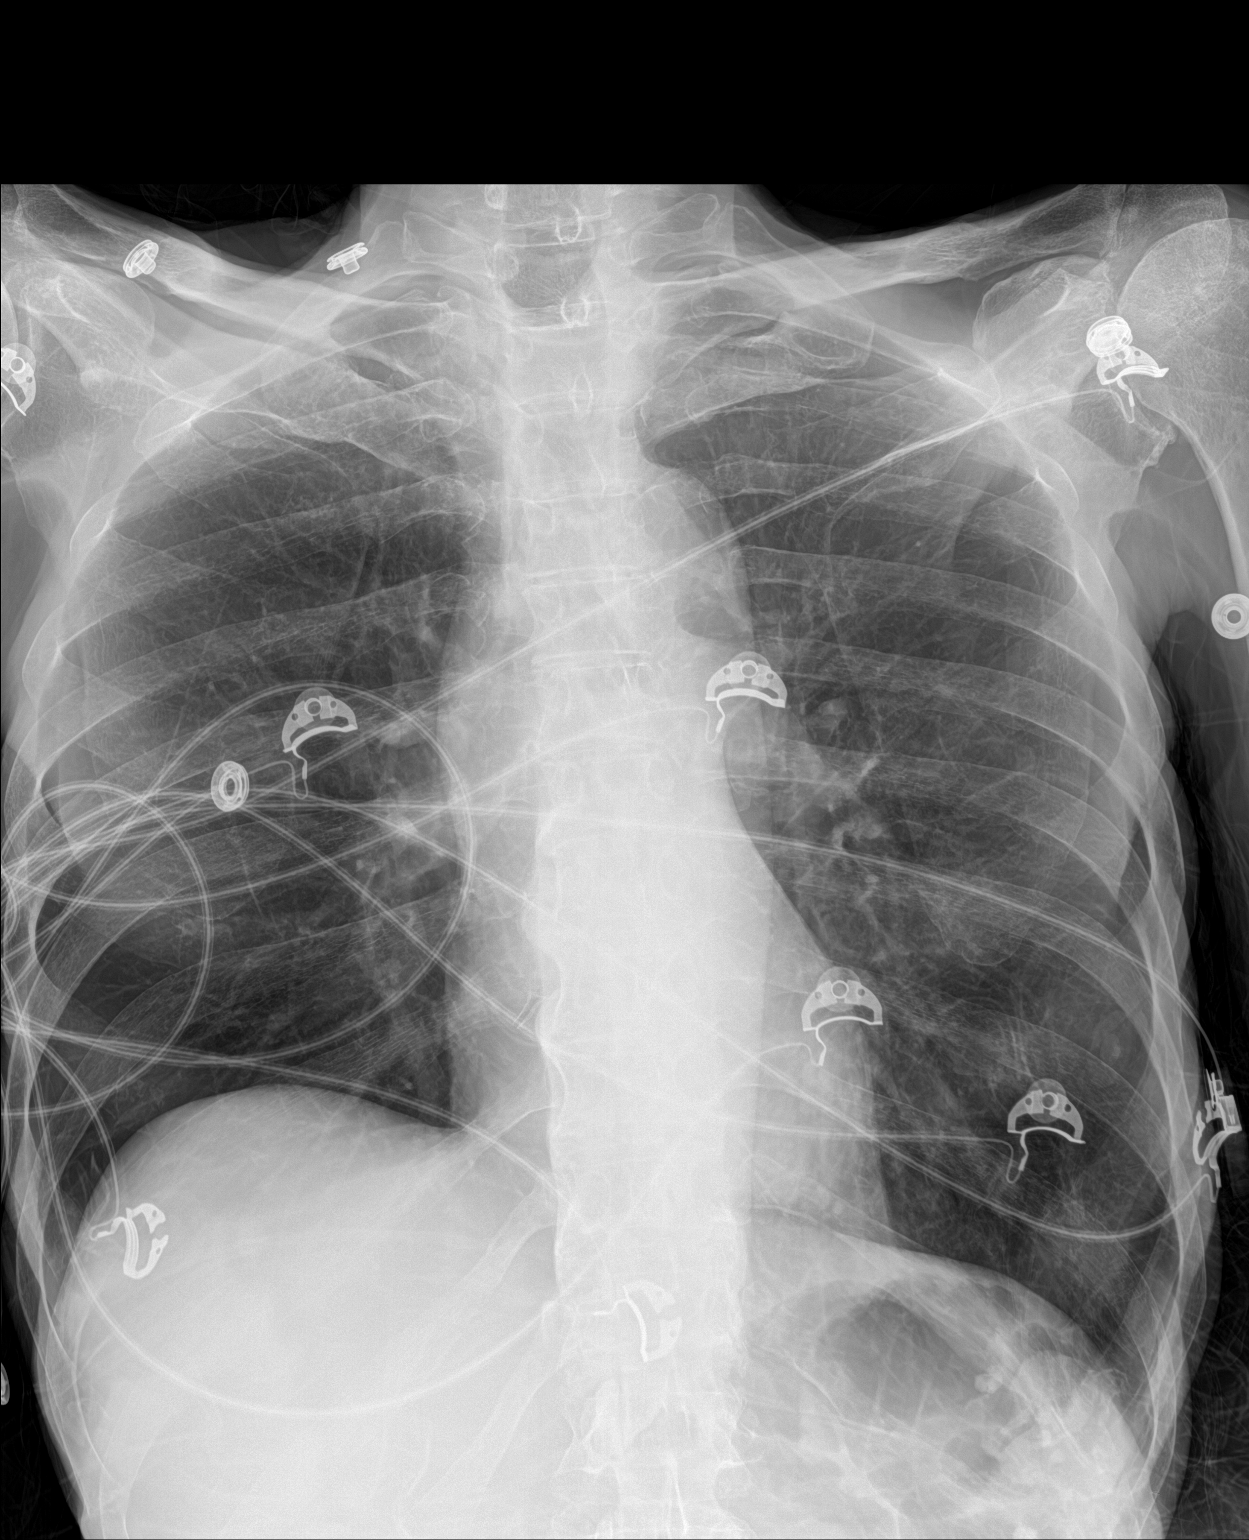

[2 of 2 positions shown; findings below may reference images not displayed]

FINDINGS: There is no consolidation or edema. No pleural effusion or
pneumothorax. Cardiomediastinal contours are within normal limits
with normal heart size.
IMPRESSION: No acute process in the chest.
